# Patient Record
Sex: Female | Born: 1995 | Race: White | Hispanic: No | Marital: Married | Smoking: Former smoker
Health system: Southern US, Community
[De-identification: ages and names within clinical notes are randomized; demographics above are authoritative.]

## PROBLEM LIST (undated history)

## (undated) ENCOUNTER — Inpatient Hospital Stay: Payer: Self-pay

## (undated) DIAGNOSIS — J45909 Unspecified asthma, uncomplicated: Secondary | ICD-10-CM

## (undated) DIAGNOSIS — F32A Depression, unspecified: Secondary | ICD-10-CM

## (undated) DIAGNOSIS — F419 Anxiety disorder, unspecified: Secondary | ICD-10-CM

## (undated) HISTORY — PX: DENTAL SURGERY: SHX609

---
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
## 2017-09-19 DIAGNOSIS — G8929 Other chronic pain: Secondary | ICD-10-CM

## 2017-09-19 HISTORY — DX: Other chronic pain: G89.29

## 2019-09-08 DIAGNOSIS — F17210 Nicotine dependence, cigarettes, uncomplicated: Secondary | ICD-10-CM | POA: Insufficient documentation

## 2019-09-08 DIAGNOSIS — Z62819 Personal history of unspecified abuse in childhood: Secondary | ICD-10-CM | POA: Insufficient documentation

## 2019-09-08 DIAGNOSIS — Z800 Family history of malignant neoplasm of digestive organs: Secondary | ICD-10-CM | POA: Insufficient documentation

## 2019-09-08 DIAGNOSIS — J4520 Mild intermittent asthma, uncomplicated: Secondary | ICD-10-CM | POA: Insufficient documentation

## 2020-01-04 DIAGNOSIS — F411 Generalized anxiety disorder: Secondary | ICD-10-CM | POA: Insufficient documentation

## 2020-06-26 ENCOUNTER — Encounter: Payer: Self-pay | Admitting: Emergency Medicine

## 2020-06-26 ENCOUNTER — Ambulatory Visit (INDEPENDENT_AMBULATORY_CARE_PROVIDER_SITE_OTHER): Payer: BC Managed Care – PPO

## 2020-06-26 ENCOUNTER — Ambulatory Visit
Admission: EM | Admit: 2020-06-26 | Discharge: 2020-06-26 | Disposition: A | Discharge status: Home / Self Care | Payer: BC Managed Care – PPO | Attending: Family Medicine | Admitting: Family Medicine

## 2020-06-26 ENCOUNTER — Other Ambulatory Visit: Payer: Self-pay

## 2020-06-26 DIAGNOSIS — S92024A Nondisplaced fracture of anterior process of right calcaneus, initial encounter for closed fracture: Secondary | ICD-10-CM

## 2020-06-26 DIAGNOSIS — M79671 Pain in right foot: Secondary | ICD-10-CM

## 2020-06-26 HISTORY — DX: Anxiety disorder, unspecified: F41.9

## 2020-06-26 HISTORY — DX: Depression, unspecified: F32.A

## 2020-06-26 MED ORDER — NAPROXEN 500 MG PO TABS: 500.0000 mg | ORAL_TABLET | Freq: Two times a day (BID) | ORAL | 0 refills | Status: DC | PRN

## 2020-06-26 NOTE — Discharge Instructions (Signed)
Rest, ice, elevation.  Call Podiatry for an appt.  Medication as prescribed.  Take care  Dr. Adriana Simas

## 2020-06-26 NOTE — ED Provider Notes (Signed)
[facility]    CSN: [id number] Arrival date & time: 06/26/20  0919      History   Chief Complaint Chief Complaint  Patient presents with  . Foot Injury    right   HPI  24 year old female presents with the above complaint.  Patient reports that she was in the yard over the weekend.  She states that she stepped in a hole on Sunday.  Since that time she has had pain on the dorsum of the foot.  No bruising.  She has difficulty bearing weight.  Pain 6/10 in severity.  No relieving factors.  No other associated symptoms.  No other complaints.  Past Medical History:  Diagnosis Date  . Anxiety   . Depression     Home Medications    Prior to Admission medications   Medication Sig Start Date End Date Taking? Authorizing Provider  naproxen (NAPROSYN) 500 MG tablet Take 1 tablet (500 mg total) by mouth 2 (two) times daily as needed for moderate pain. 06/26/20   Tommie Sams, DO   Social History Social History   Tobacco Use  . Smoking status: Current Every Day Smoker  . Smokeless tobacco: Never Used  Vaping Use  . Vaping Use: Every day  Substance Use Topics  . Alcohol use: Yes  . Drug use: Never     Allergies   Patient has no known allergies.   Review of Systems Review of Systems Per HPI  Physical Exam Triage Vital Signs ED Triage Vitals  Enc Vitals Group     BP 06/26/20 0954 125/84     Pulse Rate 06/26/20 0954 78     Resp 06/26/20 0954 18     Temp 06/26/20 0954 98.3 F (36.8 C)     Temp Source 06/26/20 0954 Oral     SpO2 06/26/20 0954 100 %     Weight 06/26/20 0951 140 lb (63.5 kg)     Height 06/26/20 0951 5' (1.524 m)     Head Circumference --      Peak Flow --      Pain Score 06/26/20 0951 6     Pain Loc --      Pain Edu? --      Excl. in GC? --    Updated Vital Signs BP 125/84 (BP Location: Right Arm)   Pulse 78   Temp 98.3 F (36.8 C) (Oral)   Resp 18   Ht 5' (1.524 m)   Wt 63.5 kg   LMP 05/29/2020   SpO2 100%   BMI 27.34  kg/m   Visual Acuity Right Eye Distance:   Left Eye Distance:   Bilateral Distance:    Right Eye Near:   Left Eye Near:    Bilateral Near:     Physical Exam Vitals and nursing note reviewed.  Constitutional:      General: She is not in acute distress.    Appearance: Normal appearance. She is not ill-appearing.  HENT:     Head: Normocephalic and atraumatic.  Eyes:     General:        Right eye: No discharge.        Left eye: No discharge.     Conjunctiva/sclera: Conjunctivae normal.  Pulmonary:     Effort: Pulmonary effort is normal. No respiratory distress.  Musculoskeletal:     Comments: Right foot -patient with exquisite tenderness on the dorsum of the proximal forefoot.  Neurological:     Mental Status: She is alert.  Psychiatric:        Mood and Affect: Mood normal.        Behavior: Behavior normal.    UC Treatments / Results  Labs (all labs ordered are listed, but only abnormal results are displayed) Labs Reviewed - No data to display  EKG   Radiology DG Foot Complete Right  Result Date: 06/26/2020 CLINICAL DATA:  Foot pain after fall yesterday. EXAM: RIGHT FOOT COMPLETE - 3+ VIEW COMPARISON:  None. FINDINGS: Possible small fracture of the anterior calcaneal process, only seen on the oblique view. No additional fracture. No dislocation. Joint spaces are preserved. Bone mineralization is normal. Soft tissues are unremarkable. IMPRESSION: 1. Possible small fracture of the anterior calcaneal process, only seen on the oblique view. Correlate with point tenderness and consider CT for further evaluation. Electronically Signed   By: Obie Dredge M.D.   On: 06/26/2020 10:44    Procedures Procedures (including critical care time)  Medications Ordered in UC Medications - No data to display  Initial Impression / Assessment and Plan / UC Course  I have reviewed the triage vital signs and the nursing notes.  Pertinent labs & imaging results that were available  during my care of the patient were reviewed by me and considered in my medical decision making (see chart for details).    24 year old female presents with foot injury.  X-ray was obtained and was independently reviewed by me.  There appears to be a small fracture/avulsion of the anterior calcaneal process.  Patient is exquisitely tender at this location.  Nonweightbearing.  Patient given crutches.  Patient advised to see podiatry.  Naproxen as directed.  Final Clinical Impressions(s) / UC Diagnoses   Final diagnoses:  Closed nondisplaced fracture of anterior process of right calcaneus, initial encounter     Discharge Instructions     Rest, ice, elevation.  Call Podiatry for an appt.  Medication as prescribed.  Take care  Dr. Adriana Simas    ED Prescriptions    Medication Sig Dispense Auth. Provider   naproxen (NAPROSYN) 500 MG tablet Take 1 tablet (500 mg total) by mouth 2 (two) times daily as needed for moderate pain. 30 tablet Tommie Sams, DO     PDMP not reviewed this encounter.   Tommie Sams, [place] 06/26/20 1402

## 2020-06-26 NOTE — ED Triage Notes (Signed)
Patient states she tripped in a hole in the yard and fell injuring her right foot.

## 2020-07-08 ENCOUNTER — Other Ambulatory Visit: Payer: Self-pay | Admitting: Family Medicine

## 2021-07-28 ENCOUNTER — Encounter: Payer: Self-pay | Admitting: Emergency Medicine

## 2021-07-28 ENCOUNTER — Emergency Department
Admission: EM | Admit: 2021-07-28 | Discharge: 2021-07-28 | Disposition: A | Discharge status: Home / Self Care | Payer: Self-pay | Attending: Emergency Medicine | Admitting: Emergency Medicine

## 2021-07-28 ENCOUNTER — Emergency Department: Payer: Self-pay

## 2021-07-28 DIAGNOSIS — W540XXA Bitten by dog, initial encounter: Secondary | ICD-10-CM | POA: Insufficient documentation

## 2021-07-28 DIAGNOSIS — Z23 Encounter for immunization: Secondary | ICD-10-CM | POA: Insufficient documentation

## 2021-07-28 DIAGNOSIS — F17200 Nicotine dependence, unspecified, uncomplicated: Secondary | ICD-10-CM | POA: Insufficient documentation

## 2021-07-28 DIAGNOSIS — S61432A Puncture wound without foreign body of left hand, initial encounter: Secondary | ICD-10-CM | POA: Insufficient documentation

## 2021-07-28 MED ORDER — BACITRACIN ZINC 500 UNIT/GM EX OINT
TOPICAL_OINTMENT | Freq: Once | CUTANEOUS | Status: AC
  Administered 2021-07-28: 1 via TOPICAL
  Filled 2021-07-28: qty 0.9

## 2021-07-28 MED ORDER — OXYCODONE HCL 5 MG PO TABS
5.0000 mg | ORAL_TABLET | Freq: Once | ORAL | Status: AC
  Administered 2021-07-28: 5 mg via ORAL
  Filled 2021-07-28: qty 1

## 2021-07-28 MED ORDER — OXYCODONE-ACETAMINOPHEN 5-325 MG PO TABS: 2.0000 | ORAL_TABLET | Freq: Once | ORAL | Status: DC

## 2021-07-28 MED ORDER — OXYCODONE-ACETAMINOPHEN 5-325 MG PO TABS
1.0000 | ORAL_TABLET | Freq: Once | ORAL | Status: AC
  Administered 2021-07-28: 1 via ORAL
  Filled 2021-07-28: qty 1

## 2021-07-28 MED ORDER — ACETAMINOPHEN 325 MG PO TABS
650.0000 mg | ORAL_TABLET | Freq: Once | ORAL | Status: AC
  Administered 2021-07-28: 650 mg via ORAL
  Filled 2021-07-28: qty 2

## 2021-07-28 MED ORDER — AMOXICILLIN-POT CLAVULANATE 875-125 MG PO TABS
1.0000 | ORAL_TABLET | Freq: Once | ORAL | Status: AC
  Administered 2021-07-28: 1 via ORAL
  Filled 2021-07-28: qty 1

## 2021-07-28 MED ORDER — IBUPROFEN 800 MG PO TABS: 800.0000 mg | ORAL_TABLET | Freq: Three times a day (TID) | ORAL | 0 refills | Status: DC | PRN

## 2021-07-28 MED ORDER — OXYCODONE-ACETAMINOPHEN 5-325 MG PO TABS: 2.0000 | ORAL_TABLET | Freq: Four times a day (QID) | ORAL | 0 refills | Status: DC | PRN

## 2021-07-28 MED ORDER — AMOXICILLIN-POT CLAVULANATE 875-125 MG PO TABS: 1.0000 | ORAL_TABLET | Freq: Two times a day (BID) | ORAL | 0 refills | Status: AC

## 2021-07-28 MED ORDER — TETANUS-DIPHTH-ACELL PERTUSSIS 5-2.5-18.5 LF-MCG/0.5 IM SUSY
0.5000 mL | PREFILLED_SYRINGE | Freq: Once | INTRAMUSCULAR | Status: AC
  Administered 2021-07-28: 0.5 mL via INTRAMUSCULAR
  Filled 2021-07-28: qty 0.5

## 2021-07-28 NOTE — ED Triage Notes (Signed)
Pt in after dog bite to L hand from she and neighbor's dog while they were play-fighting. Puncture wounds present, limited ROM, bleeding controlled. Both her and the neighbor's dogs up to date on vaccines

## 2021-07-28 NOTE — ED Provider Notes (Signed)
[facility] Emergency Department Provider Note ____________________________________________   Event Date/Time   First MD Initiated Contact with Patient 07/28/21 [phone]     (approximate)  I have reviewed the triage vital signs and the nursing notes.   HISTORY  Chief Complaint Animal Bite    HPI Jaclyn Patterson is a 25 y.o. female with history of anxiety and depression who presents to the emergency department with a dog bite to the left hand.  She also has multiple scratches.  States she tried to break up a fight between her dog and her neighbors dog.  She states her dog is definitely up-to-date on rabies vaccinations and she thinks that her neighbors is as well.  She states that her neighbor has the dog in their custody.  No other injuries.  Was not knocked to the ground.  Did not hit her head or lose consciousness.         Past Medical History:  Diagnosis Date   Anxiety    Depression     There are no problems to display for this patient.   History reviewed. No pertinent surgical history.  Prior to Admission medications   Medication Sig Start Date End Date Taking? Authorizing Provider  amoxicillin-clavulanate (AUGMENTIN) 875-125 MG tablet Take 1 tablet by mouth 2 (two) times daily for 7 days. 07/28/21 08/04/21 Yes Dalisha Shively N, DO  ibuprofen (ADVIL) 800 MG tablet Take 1 tablet (800 mg total) by mouth every 8 (eight) hours as needed for mild pain. 07/28/21  Yes Keera Altidor, Layla Maw, DO  oxyCODONE-acetaminophen (PERCOCET/ROXICET) 5-325 MG tablet Take 2 tablets by mouth every 6 (six) hours as needed. 07/28/21  Yes Braelynn Benning N, DO  naproxen (NAPROSYN) 500 MG tablet Take 1 tablet (500 mg total) by mouth 2 (two) times daily as needed for moderate pain. 06/26/20   Tommie Sams, DO    Allergies Patient has no known allergies.  No family history on file.  Social History Social History   Tobacco Use   Smoking status: Every Day   Smokeless tobacco:  Never  Vaping Use   Vaping Use: Every day  Substance Use Topics   Alcohol use: Yes   Drug use: Never    Review of Systems Constitutional: No fever. Eyes: No visual changes. ENT: No sore throat. Cardiovascular: Denies chest pain. Respiratory: Denies shortness of breath. Gastrointestinal: No nausea, vomiting, diarrhea. Genitourinary: Negative for dysuria. Musculoskeletal: Negative for back pain. Skin: Negative for rash. Neurological: Negative for focal weakness or numbness.   ____________________________________________   PHYSICAL EXAM:  VITAL SIGNS: ED Triage Vitals  Enc Vitals Group     BP 07/28/21 0201 137/83     Pulse Rate 07/28/21 0201 (!) 124     Resp 07/28/21 0201 18     Temp 07/28/21 0201 97.9 F (36.6 C)     Temp Source 07/28/21 0201 Oral     SpO2 07/28/21 0201 97 %     Weight 07/28/21 0202 152 lb (68.9 kg)     Height --      Head Circumference --      Peak Flow --      Pain Score --      Pain Loc --      Pain Edu? --      Excl. in GC? --    CONSTITUTIONAL: Alert and oriented and responds appropriately to questions. Well-appearing; well-nourished; GCS 15 HEAD: Normocephalic; atraumatic EYES: Conjunctivae clear, PERRL, EOMI ENT: normal nose; no rhinorrhea; moist mucous  membranes; pharynx without lesions noted; no dental injury; no septal hematoma NECK: Supple, no meningismus, no LAD; no midline spinal tenderness, step-off or deformity; trachea midline CARD: RRR; S1 and S2 appreciated; no murmurs, no clicks, no rubs, no gallops RESP: Normal chest excursion without splinting or tachypnea; breath sounds clear and equal bilaterally; no wheezes, no rhonchi, no rales; no hypoxia or respiratory distress CHEST:  chest wall stable, no crepitus or ecchymosis or deformity, nontender to palpation; no flail chest ABD/GI: Normal bowel sounds; non-distended; soft, non-tender, no rebound, no guarding; no ecchymosis or other lesions noted PELVIS:  stable, nontender to  palpation BACK:  The back appears normal and is non-tender to palpation, there is no CVA tenderness; no midline spinal tenderness, step-off or deformity EXT: Multiple superficial abrasions to all 4 extremities.  She has superficial puncture wounds to her dorsal left hand and to the palm over the thenar eminence.  She has soft tissue swelling and ecchymosis to the left hand but no bony deformity.  She is able to move all of her fingers normally.  Compartments are soft in all of her extremities.  Extremities are warm and well-perfused.  Normal capillary refill.  Normal sensation. SKIN: Normal color for age and race; warm NEURO: Moves all extremities equally, normal speech, normal gait PSYCH: The patient's mood and manner are appropriate. Grooming and personal hygiene are appropriate.  ____________________________________________   LABS (all labs ordered are listed, but only abnormal results are displayed)  Labs Reviewed - No data to display ____________________________________________  EKG   ____________________________________________  RADIOLOGY I, Beckem Tomberlin, personally viewed and evaluated these images (plain radiographs) as part of my medical decision making, as well as reviewing the written report by the radiologist.  ED MD interpretation: X-ray of the left hand shows no fracture or dislocation.  Official radiology report(s): DG Hand Complete Left  Result Date: 07/28/2021 CLINICAL DATA:  Recent dog bite, initial encounter EXAM: LEFT HAND - COMPLETE 3+ VIEW COMPARISON:  None. FINDINGS: No acute fracture or dislocation is noted. No soft tissue abnormality is seen. IMPRESSION: No acute abnormality noted. Electronically Signed   By: Alcide Clever M.D.   On: 07/28/2021 03:01    ____________________________________________   PROCEDURES  Procedure(s) performed (including Critical Care):  Procedures    ____________________________________________   INITIAL IMPRESSION /  ASSESSMENT AND PLAN / ED COURSE  As part of my medical decision making, I reviewed the following data within the electronic MEDICAL RECORD NUMBER Nursing notes reviewed and incorporated, Old chart reviewed, Radiograph reviewed , Notes from prior ED visits, and [place] Controlled Substance Database         Patient here after a dog bite.  She has puncture wounds but no lacerations that need repair.  We have cleaned her wounds and applied bacitracin and sterile dressings.  Tetanus vaccination has been updated here.  Will discharge with Augmentin.  Provided with pain medication.  Discussed wound care instructions and return precautions.  Patient verbalized understanding.  I did instruct her to make sure that her neighbors dog is up-to-date on the rabies vaccines.  We discussed that if this dog was not up-to-date that she will need to return to the emergency department or urgent care immediately as she then would need rabies vaccines and immunoglobulin.  Patient verbalized understanding and is comfortable with this plan.  At this time, I do not feel there is any life-threatening condition present. I have reviewed, interpreted and discussed all results (EKG, imaging, lab, urine as appropriate) and  exam findings with patient/family. I have reviewed nursing notes and appropriate previous records.  I feel the patient is safe to be discharged home without further emergent workup and can continue workup as an outpatient as needed. Discussed usual and customary return precautions. Patient/family verbalize understanding and are comfortable with this plan.  Outpatient follow-up has been provided as needed. All questions have been answered.   ____________________________________________   FINAL CLINICAL IMPRESSION(S) / ED DIAGNOSES  Final diagnoses:  Dog bite, initial encounter     ED Discharge Orders          Ordered    ibuprofen (ADVIL) 800 MG tablet  Every 8 hours PRN        07/28/21 0512     amoxicillin-clavulanate (AUGMENTIN) 875-125 MG tablet  2 times daily        07/28/21 0512    oxyCODONE-acetaminophen (PERCOCET/ROXICET) 5-325 MG tablet  Every 6 hours PRN        07/28/21 2979            *Please note:  Jaclyn Patterson was evaluated in Emergency Department on 07/28/2021 for the symptoms described in the history of present illness. She was evaluated in the context of the global COVID-19 pandemic, which necessitated consideration that the patient might be at risk for infection with the SARS-CoV-2 virus that causes COVID-19. Institutional protocols and algorithms that pertain to the evaluation of patients at risk for COVID-19 are in a state of rapid change based on information released by regulatory bodies including the CDC and federal and state organizations. These policies and algorithms were followed during the patient's care in the ED.  Some ED evaluations and interventions may be delayed as a result of limited staffing during and the pandemic.*   Note:  This document was prepared using Dragon voice recognition software and may include unintentional dictation errors.    Aoki Wedemeyer, Layla Maw, DO 07/28/21 [phone]

## 2021-07-28 NOTE — ED Notes (Signed)
Pt washed hands with warm soapy water, dressings placed per md instructions, per md no need to call animal control, states that pt will follow up with her neighbor and return if it dog is unvaccinated.  Pt states that she is ready to go home, verbalized understanding d/c instructions and follow up. Ambulatory from dpt with friend.

## 2021-07-28 NOTE — Discharge Instructions (Addendum)
You are being provided a prescription for opiates (also known as narcotics) for pain control.  Opiates can be addictive and should only be used when absolutely necessary for pain control when other alternatives do not work.  We recommend you only use them for the recommended amount of time and only as prescribed.  Please do not take with other sedative medications or alcohol.  Please do not drive, operate machinery, make important decisions while taking opiates.  Please note that these medications can be addictive and have high abuse potential.  Patients can become addicted to narcotics after only taking them for a few days.  Please keep these medications locked away from children, teenagers or any family members with history of substance abuse.  Narcotic pain medicine may also make you constipated.  You may use over-the-counter medications such as MiraLAX, Colace to prevent constipation.  If you become constipated you may use over-the-counter enemas as needed.  Itching and nausea are common side effects of narcotic pain medication.  If you develop uncontrolled vomiting or a rash, please stop these medications.   You may clean your wounds gently with warm soap and water 1-2 times a day and apply bacitracin ointment once daily.  Your x-ray showed no sign of fracture.  I recommend that you make sure that your neighbors dog is up-to-date on his rabies vaccines.  If not, please return to the emergency department or urgent care as you will need to start rabies vaccines yourself.  Please take your antibiotics until complete.  Dog bites are very high risk to become infected.

## 2021-07-28 NOTE — ED Provider Notes (Signed)
Emergency Medicine Provider Triage Evaluation Note  Jaclyn Patterson , a 25 y.o. female  was evaluated in triage.  Pt complains of left hand pain.  She was trying to break up a dog fight between her large dog and the neighbors large dog.  She is not sure who bit her.  She has a few lacerations and pain and swelling throughout the hand.  She rinsed her hand off at home.  Uncertain date of last tetanus vaccination.  Review of Systems  Positive: Multiple lacerations due to dog bite, swelling, pain Negative: Any other injuries, loss of consciousness, nausea, vomiting.  Physical Exam  BP 137/83   Pulse (!) 124   Temp 97.9 F (36.6 C) (Oral)   Resp 18   Wt 68.9 kg   SpO2 97%   BMI 29.69 kg/m  Gen:   Awake, appears uncomfortable but not in severe distress Resp:  Normal effort  MSK:   Multiple lacerations that appear superficial to the patient's hand and fingers but with ecchymosis and swelling throughout the hand suggestive of a crush injury.  Compartments are soft and easily compressible. Other:  Neurovascularly intact.  Medical Decision Making  Medically screening exam initiated at 2:02 AM.  Appropriate orders placed.  Jaclyn Patterson was informed that the remainder of the evaluation will be completed by another provider, this initial triage assessment does not replace that evaluation, and the importance of remaining in the ED until their evaluation is complete.  Tdap, Percocet x1 with an additional acetaminophen 650 mg, Augmentin, x-rays.  I had the patient rinse her hand off again thoroughly to try to get it as clean as possible while she is awaiting further evaluation.   Jaclyn Rose, MD 07/28/21 [phone]

## 2021-07-28 NOTE — ED Notes (Signed)
Pt ambulatory to er hallway 18, pt states that she broke up a fight between her dog and her neighbor's dog, pt has scratches to R leg and arms, pt has puncture wounds to her L hand, pt has some swelling to L hand, bleeding is controled/ stopped at this time, pt states that both dogs have had their shots.  Pt appears anxious, attempt to calm and support pt.

## 2021-09-09 NOTE — L&D Delivery Note (Addendum)
Operative Delivery Note At 4:43 PM a viable and healthy female "Mae" was delivered via VBAC, Vacuum Assisted.  Presentation: vertex; Position: Right,OA; Station: -2.  Verbal consent: obtained from patient.  Risks and benefits discussed in detail.  Risks include, but are not limited to the risks of anesthesia, bleeding, infection, damage to maternal tissues, fetal cephalhematoma.  There is also the risk of inability to effect vaginal delivery of the head, or shoulder dystocia that cannot be resolved by established maneuvers, leading to the need for emergency cesarean section.  Deep variables with pushing  APGAR: 7, 9; weight pending.   Placenta status: expressed, .   Cord: 3vc with the following complications: nuchal x2.  Cord pH: pending  Anesthesia:  epidural, 36ml lidocaine Instruments: flat kiwi Episiotomy: None Lacerations: 2nd degree Suture Repair: 2.0 vicryl Est. Blood Loss (mL):  300  Mom to postpartum.  Baby to Couplet care / Skin to Skin.  Christeen Douglas 08/07/2022, 5:09 PM

## 2021-12-31 LAB — OB RESULTS CONSOLE VARICELLA ZOSTER ANTIBODY, IGG: Varicella: NON-IMMUNE/NOT IMMUNE

## 2022-01-03 DIAGNOSIS — Z3490 Encounter for supervision of normal pregnancy, unspecified, unspecified trimester: Secondary | ICD-10-CM | POA: Insufficient documentation

## 2022-01-03 LAB — OB RESULTS CONSOLE ABO/RH: RH Type: POSITIVE

## 2022-01-03 LAB — OB RESULTS CONSOLE RUBELLA ANTIBODY, IGM: Rubella: NON-IMMUNE/NOT IMMUNE

## 2022-01-03 LAB — OB RESULTS CONSOLE GC/CHLAMYDIA
Chlamydia: NEGATIVE
Neisseria Gonorrhea: NEGATIVE

## 2022-01-03 LAB — OB RESULTS CONSOLE RPR: RPR: NONREACTIVE

## 2022-01-03 LAB — OB RESULTS CONSOLE HIV ANTIBODY (ROUTINE TESTING): HIV: NONREACTIVE

## 2022-01-03 LAB — OB RESULTS CONSOLE HEPATITIS B SURFACE ANTIGEN: Hepatitis B Surface Ag: NEGATIVE

## 2022-01-03 LAB — OB RESULTS CONSOLE ANTIBODY SCREEN: Antibody Screen: NEGATIVE

## 2022-01-03 LAB — HEPATITIS C ANTIBODY: HCV Ab: NEGATIVE

## 2022-01-08 DIAGNOSIS — R011 Cardiac murmur, unspecified: Secondary | ICD-10-CM | POA: Insufficient documentation

## 2022-03-15 ENCOUNTER — Other Ambulatory Visit: Payer: Self-pay

## 2022-03-15 ENCOUNTER — Emergency Department: Payer: Medicaid Other

## 2022-03-15 ENCOUNTER — Encounter: Payer: Self-pay | Admitting: Emergency Medicine

## 2022-03-15 DIAGNOSIS — O26892 Other specified pregnancy related conditions, second trimester: Secondary | ICD-10-CM | POA: Insufficient documentation

## 2022-03-15 DIAGNOSIS — Z3A18 18 weeks gestation of pregnancy: Secondary | ICD-10-CM | POA: Insufficient documentation

## 2022-03-15 DIAGNOSIS — M545 Low back pain, unspecified: Secondary | ICD-10-CM | POA: Diagnosis not present

## 2022-03-15 DIAGNOSIS — O99891 Other specified diseases and conditions complicating pregnancy: Secondary | ICD-10-CM | POA: Insufficient documentation

## 2022-03-15 DIAGNOSIS — R1032 Left lower quadrant pain: Secondary | ICD-10-CM | POA: Insufficient documentation

## 2022-03-15 LAB — CBC
HCT: 33.8 % — ABNORMAL LOW (ref 36.0–46.0)
Hemoglobin: 11.6 g/dL — ABNORMAL LOW (ref 12.0–15.0)
MCH: 29.7 pg (ref 26.0–34.0)
MCHC: 34.3 g/dL (ref 30.0–36.0)
MCV: 86.4 fL (ref 80.0–100.0)
Platelets: 262 10*3/uL (ref 150–400)
RBC: 3.91 MIL/uL (ref 3.87–5.11)
RDW: 12.3 % (ref 11.5–15.5)
WBC: 8.6 10*3/uL (ref 4.0–10.5)
nRBC: 0 % (ref 0.0–0.2)

## 2022-03-15 LAB — URINALYSIS, ROUTINE W REFLEX MICROSCOPIC
Bilirubin Urine: NEGATIVE
Glucose, UA: NEGATIVE mg/dL
Hgb urine dipstick: NEGATIVE
Ketones, ur: NEGATIVE mg/dL
Leukocytes,Ua: NEGATIVE
Nitrite: NEGATIVE
Protein, ur: NEGATIVE mg/dL
Specific Gravity, Urine: 1.006 (ref 1.005–1.030)
pH: 7 (ref 5.0–8.0)

## 2022-03-15 LAB — COMPREHENSIVE METABOLIC PANEL
ALT: 21 U/L (ref 0–44)
AST: 21 U/L (ref 15–41)
Albumin: 3.4 g/dL — ABNORMAL LOW (ref 3.5–5.0)
Alkaline Phosphatase: 56 U/L (ref 38–126)
Anion gap: 7 (ref 5–15)
BUN: <5 mg/dL — ABNORMAL LOW (ref 6–20)
CO2: 21 mmol/L — ABNORMAL LOW (ref 22–32)
Calcium: 8.9 mg/dL (ref 8.9–10.3)
Chloride: 109 mmol/L (ref 98–111)
Creatinine, Ser: 0.59 mg/dL (ref 0.44–1.00)
GFR, Estimated: >60 mL/min (ref >60)
Glucose, Bld: 108 mg/dL — ABNORMAL HIGH (ref 70–99)
Potassium: 3.3 mmol/L — ABNORMAL LOW (ref 3.5–5.1)
Sodium: 137 mmol/L (ref 135–145)
Total Bilirubin: 0.4 mg/dL (ref 0.3–1.2)
Total Protein: 6.5 g/dL (ref 6.5–8.1)

## 2022-03-15 LAB — HCG, QUANTITATIVE, PREGNANCY: hCG, Beta Chain, Quant, S: 28420 m[IU]/mL — ABNORMAL HIGH (ref <5)

## 2022-03-15 NOTE — ED Provider Triage Note (Signed)
Emergency Medicine Provider Triage Evaluation Note  Jaclyn Patterson, a 26 y.o. G1, P0 female at [redacted] weeks gestation was evaluated in triage.  Pt complains of left lower quadrant abdominal pain with referral into the back and buttocks.  Patient reports symptoms onset about 4 hours prior to arrival.  She denies any vaginal bleeding or pregnancy related complaints.  Review of Systems  Positive: LLQ abdominal pain Negative: Vaginal bleeding, CP, SOB  Physical Exam  BP 138/78   Pulse 94   Temp 98 F (36.7 C) (Oral)   Resp 16   Ht 5' (1.524 m)   Wt 69.4 kg   LMP 07/25/2021   SpO2 100%   BMI 29.88 kg/m  Gen:   Awake, no distress uncomfortable Resp:  Normal effort CTA MSK:   Moves extremities without difficulty  Other:  Gravid, tender to palp over LLQ  Medical Decision Making  Medically screening exam initiated at 7:50 PM.  Appropriate orders placed.  Amos Kannan was informed that the remainder of the evaluation will be completed by another provider, this initial triage assessment does not replace that evaluation, and the importance of remaining in the ED until their evaluation is complete.  Pregnant female 19 weeks with sudden onset of left lower quadrant abdominal pain.  He denies any nausea or vomiting.  Patient denies any urgency related concerns.   Lissa Hoard, PA-C 03/15/22 [id number]

## 2022-03-15 NOTE — ED Triage Notes (Signed)
Pt states llq pain that radiates to her back and into her buttocks for last hour. Pt denies vaginal bleeding pt is 19 weeks preg today, G1, P0.

## 2022-03-16 ENCOUNTER — Emergency Department
Admission: EM | Admit: 2022-03-16 | Discharge: 2022-03-16 | Disposition: A | Discharge status: Home / Self Care | Payer: Medicaid Other | Attending: Emergency Medicine | Admitting: Emergency Medicine

## 2022-03-16 DIAGNOSIS — R1032 Left lower quadrant pain: Secondary | ICD-10-CM

## 2022-03-16 MED ORDER — LIDOCAINE 5 % EX PTCH
1.0000 | MEDICATED_PATCH | CUTANEOUS | Status: DC
  Administered 2022-03-16: 1 via TRANSDERMAL
  Filled 2022-03-16: qty 1

## 2022-03-16 MED ORDER — LIDOCAINE 5 % EX PTCH: 1.0000 | MEDICATED_PATCH | CUTANEOUS | 0 refills | Status: AC

## 2022-03-16 MED ORDER — ACETAMINOPHEN 500 MG PO TABS
1000.0000 mg | ORAL_TABLET | Freq: Once | ORAL | Status: AC
  Administered 2022-03-16: 1000 mg via ORAL
  Filled 2022-03-16: qty 2

## 2022-03-16 NOTE — Discharge Instructions (Signed)
You may take Tylenol 1000 mg every 6 hours as needed for pain.  You may alternate ice and heat to your back.  Please do not sleep with a heating pad as this can be dangerous and cause burns.  I do not recommend that you put the heating pad on your abdomen.  You may use numbing patches to your back.  Please follow-up if your OB/GYN if symptoms or not improving.  If you develop fever of 100.4 or higher, vaginal bleeding, vomiting that does not stop, numbness, weakness in your legs, difficulty holding your bowel or bladder, difficulty emptying your bladder, please return to the emergency department.

## 2022-03-16 NOTE — ED Notes (Signed)
E-signature pad unavailable - Pt verbalized understanding of D/C information - no additional concerns at this time.  

## 2022-03-16 NOTE — ED Provider Notes (Signed)
[facility] Provider Note    Event Date/Time   First MD Initiated Contact with Patient 03/16/22 0031     (approximate)   History   Abdominal Pain   HPI  Jaclyn Patterson is a 26 y.o. female who is approximately [redacted] weeks pregnant who presents to the emergency department with sharp left lower quadrant pain that is worse with moving especially with rolling over onto her side.  She also has some left lower back pain, pain over the SI joint.  States she was worried because she has had 2 previous miscarriages and has never made it this far in the pregnancy before.  No fevers, nausea, vomiting, diarrhea,, bloody stools, melena, dysuria, hematuria, vaginal bleeding or discharge.  No numbness, tingling, weakness, bowel or bladder incontinence, urinary retention.    History provided by patient and significant other.    Past Medical History:  Diagnosis Date   Anxiety    Depression     No past surgical history on file.  MEDICATIONS:  Prior to Admission medications   Medication Sig Start Date End Date Taking? Authorizing Provider  ibuprofen (ADVIL) 800 MG tablet Take 1 tablet (800 mg total) by mouth every 8 (eight) hours as needed for mild pain. 07/28/21   Akeelah Seppala, Delice Bison, DO  naproxen (NAPROSYN) 500 MG tablet Take 1 tablet (500 mg total) by mouth 2 (two) times daily as needed for moderate pain. 06/26/20   Coral Spikes, DO  oxyCODONE-acetaminophen (PERCOCET/ROXICET) 5-325 MG tablet Take 2 tablets by mouth every 6 (six) hours as needed. 07/28/21   Traylon Schimming, Delice Bison, DO    Physical Exam   Triage Vital Signs: ED Triage Vitals  Enc Vitals Group     BP 03/15/22 1928 138/78     Pulse Rate 03/15/22 1928 94     Resp 03/15/22 1928 16     Temp 03/15/22 1928 98 F (36.7 C)     Temp Source 03/15/22 1928 Oral     SpO2 03/15/22 1928 100 %     Weight 03/15/22 1929 153 lb (69.4 kg)     Height 03/15/22 1929 5' (1.524 m)     Head Circumference --      Peak Flow --       Pain Score 03/15/22 1929 6     Pain Loc --      Pain Edu? --      Excl. in [place]? --     Most recent vital signs: Vitals:   03/15/22 1928 03/15/22 2328  BP: 138/78 118/72  Pulse: 94 79  Resp: 16 16  Temp: 98 F (36.7 C)   SpO2: 100% 100%    CONSTITUTIONAL: Alert and oriented and responds appropriately to questions. Well-appearing; well-nourished HEAD: Normocephalic, atraumatic EYES: Conjunctivae clear, pupils appear equal, sclera nonicteric ENT: normal nose; moist mucous membranes NECK: Supple, normal ROM CARD: RRR; S1 and S2 appreciated; no murmurs, no clicks, no rubs, no gallops RESP: Normal chest excursion without splinting or tachypnea; breath sounds clear and equal bilaterally; no wheezes, no rhonchi, no rales, no hypoxia or respiratory distress, speaking full sentences ABD/GI: Normal bowel sounds; non-distended; soft, mild tenderness in the left lower abdomen without guarding or rebound BACK: The back appears normal, no midline spinal tenderness or step-off or deformity.  Patient has some tenderness over the left SI joint without overlying redness, warmth, ecchymosis. EXT: Normal ROM in all joints; no deformity noted, no edema; no cyanosis SKIN: Normal color for age and race; warm; no  rash on exposed skin NEURO: Moves all extremities equally, normal speech, normal sensation diffusely, normal gait PSYCH: The patient's mood and manner are appropriate.   ED Results / Procedures / Treatments   LABS: (all labs ordered are listed, but only abnormal results are displayed) Labs Reviewed  COMPREHENSIVE METABOLIC PANEL - Abnormal; Notable for the following components:      Result Value   Potassium 3.3 (*)    CO2 21 (*)    Glucose, Bld 108 (*)    BUN <5 (*)    Albumin 3.4 (*)    All other components within normal limits  CBC - Abnormal; Notable for the following components:   Hemoglobin 11.6 (*)    HCT 33.8 (*)    All other components within normal limits  URINALYSIS, ROUTINE  W REFLEX MICROSCOPIC - Abnormal; Notable for the following components:   Color, Urine YELLOW (*)    APPearance HAZY (*)    All other components within normal limits  HCG, QUANTITATIVE, PREGNANCY - Abnormal; Notable for the following components:   hCG, Beta Chain, Quant, S 28,420 (*)    All other components within normal limits     EKG:     RADIOLOGY: My personal review and interpretation of imaging: Ultrasound shows normal intrauterine pregnancy with normal fetal heart rate.  I have personally reviewed all radiology reports.   US OB Limited  Result Date: 03/15/2022 CLINICAL DATA:  Left lower quadrant pain for several hours, known second trimester pregnancy EXAM: LIMITED OBSTETRIC ULTRASOUND COMPARISON:  None Available. FINDINGS: Number of Fetuses: 1 Heart Rate:  143 bpm Movement: Present Presentation: Cephalic Placental Location: Anterior Previa: Absent Amniotic Fluid (Subjective):  Within normal limits. AFI: 4.5 cm BPD: 4 cm 18 w  1 d MATERNAL FINDINGS: Cervix:  Appears closed. Uterus/Adnexae: No abnormality visualized. Right ovary is not well visualized. IMPRESSION: Single live intrauterine gestation at 18 weeks 1 day. No acute abnormality noted. This exam is performed on an emergent basis and does not comprehensively evaluate fetal size, dating, or anatomy; follow-up complete OB US should be considered if further fetal assessment is warranted. Electronically Signed   By: Alcide Clever M.D.   On: 03/15/2022 20:22     PROCEDURES:  Critical Care performed: No      Procedures    IMPRESSION / MDM / ASSESSMENT AND PLAN / ED COURSE  I reviewed the triage vital signs and the nursing notes.    Patient here with left pelvic pain, left lower back pain worse after moving.  Currently about [redacted] weeks pregnant.     DIFFERENTIAL DIAGNOSIS (includes but not limited to):   Suspect round ligament pain.  Pain also may be due to musculoskeletal back pain, sacroiliitis.  Doubt epidural  abscess, hematoma, discitis, osteomyelitis, transverse myelitis, severe spinal stenosis.  Low suspicion for diverticulitis, colitis, bowel obstruction, ovarian torsion, TOA, PID.   Patient's presentation is most consistent with acute presentation with potential threat to life or bodily function.   PLAN: We will obtain CBC, CMP, urinalysis, pelvic ultrasound.  Will give Tylenol and Lidoderm patch for pain control.   MEDICATIONS GIVEN IN ED: Medications  lidocaine (LIDODERM) 5 % 1 patch (1 patch Transdermal Patch Applied 03/16/22 0112)  acetaminophen (TYLENOL) tablet 1,000 mg (1,000 mg Oral Given 03/16/22 0112)     ED COURSE: Labs show no leukocytosis.  Normal electrolytes, renal function, LFTs.  Urine shows no sign of infection.  No proteinuria, no ketonuria.  hCG is greater than 28,000.  Ultrasound reviewed/interpreted  by myself and radiology and shows single live intrauterine pregnancy measuring 18 weeks and 1 day with normal fetal heart tone and normal movement.  No significant abnormality noted of the left ovary.  Patient comfortable discharging with Lidoderm patches and using over-the-counter Tylenol for pain control.  Recommended alternating heat, ice to her back, massage, stretching.  She will follow-up with her OB/GYN if symptoms or not improving.  Discussed return precautions with patient and partner.   At this time, I do not feel there is any life-threatening condition present. I reviewed all nursing notes, vitals, pertinent previous records.  All lab and urine results, EKGs, imaging ordered have been independently reviewed and interpreted by myself.  I reviewed all available radiology reports from any imaging ordered this visit.  Based on my assessment, I feel the patient is safe to be discharged home without further emergent workup and can continue workup as an outpatient as needed. Discussed all findings, treatment plan as well as usual and customary return precautions.  They verbalize  understanding and are comfortable with this plan.  Outpatient follow-up has been provided as needed.  All questions have been answered.    CONSULTS: No admission or OB/GYN consult needed today as patient's work-up has been reassuring and pain likely due to round ligament pain.   OUTSIDE RECORDS REVIEWED: Reviewed patient's last OB/GYN note on 02/28/2022.  It appears during that visit she complained of intermittent lower abdominal pain as well.       FINAL CLINICAL IMPRESSION(S) / ED DIAGNOSES   Final diagnoses:  None     Rx / DC Orders   ED Discharge Orders     None        Note:  This document was prepared using Dragon voice recognition software and may include unintentional dictation errors.   Tyberius Ryner, Layla Maw, DO 03/16/22 1002

## 2022-05-23 ENCOUNTER — Observation Stay
Admission: EM | Admit: 2022-05-23 | Discharge: 2022-05-23 | Disposition: A | Discharge status: Home / Self Care | Payer: Medicaid Other | Attending: Obstetrics and Gynecology | Admitting: Obstetrics and Gynecology

## 2022-05-23 ENCOUNTER — Other Ambulatory Visit: Payer: Self-pay

## 2022-05-23 ENCOUNTER — Observation Stay: Payer: Medicaid Other

## 2022-05-23 DIAGNOSIS — O26899 Other specified pregnancy related conditions, unspecified trimester: Secondary | ICD-10-CM | POA: Diagnosis present

## 2022-05-23 DIAGNOSIS — Z3A28 28 weeks gestation of pregnancy: Secondary | ICD-10-CM | POA: Insufficient documentation

## 2022-05-23 DIAGNOSIS — Z79899 Other long term (current) drug therapy: Secondary | ICD-10-CM | POA: Diagnosis not present

## 2022-05-23 DIAGNOSIS — J45909 Unspecified asthma, uncomplicated: Secondary | ICD-10-CM | POA: Insufficient documentation

## 2022-05-23 DIAGNOSIS — O26893 Other specified pregnancy related conditions, third trimester: Secondary | ICD-10-CM | POA: Diagnosis present

## 2022-05-23 DIAGNOSIS — O26833 Pregnancy related renal disease, third trimester: Secondary | ICD-10-CM | POA: Diagnosis not present

## 2022-05-23 DIAGNOSIS — N200 Calculus of kidney: Secondary | ICD-10-CM | POA: Diagnosis not present

## 2022-05-23 DIAGNOSIS — O99513 Diseases of the respiratory system complicating pregnancy, third trimester: Secondary | ICD-10-CM | POA: Diagnosis not present

## 2022-05-23 DIAGNOSIS — O99333 Smoking (tobacco) complicating pregnancy, third trimester: Secondary | ICD-10-CM | POA: Diagnosis not present

## 2022-05-23 DIAGNOSIS — F17200 Nicotine dependence, unspecified, uncomplicated: Secondary | ICD-10-CM | POA: Insufficient documentation

## 2022-05-23 DIAGNOSIS — R1031 Right lower quadrant pain: Secondary | ICD-10-CM | POA: Insufficient documentation

## 2022-05-23 HISTORY — DX: Unspecified asthma, uncomplicated: J45.909

## 2022-05-23 LAB — CBC
HCT: 33.6 % — ABNORMAL LOW (ref 36.0–46.0)
Hemoglobin: 11.3 g/dL — ABNORMAL LOW (ref 12.0–15.0)
MCH: 29.7 pg (ref 26.0–34.0)
MCHC: 33.6 g/dL (ref 30.0–36.0)
MCV: 88.2 fL (ref 80.0–100.0)
Platelets: 193 10*3/uL (ref 150–400)
RBC: 3.81 MIL/uL — ABNORMAL LOW (ref 3.87–5.11)
RDW: 12.6 % (ref 11.5–15.5)
WBC: 14.7 10*3/uL — ABNORMAL HIGH (ref 4.0–10.5)
nRBC: 0 % (ref 0.0–0.2)

## 2022-05-23 LAB — WET PREP, GENITAL
Clue Cells Wet Prep HPF POC: NONE SEEN
Sperm: NONE SEEN
Trich, Wet Prep: NONE SEEN
WBC, Wet Prep HPF POC: <10 — AB (ref <10)
Yeast Wet Prep HPF POC: NONE SEEN

## 2022-05-23 LAB — COMPREHENSIVE METABOLIC PANEL
ALT: 18 U/L (ref 0–44)
AST: 18 U/L (ref 15–41)
Albumin: 3.3 g/dL — ABNORMAL LOW (ref 3.5–5.0)
Alkaline Phosphatase: 57 U/L (ref 38–126)
Anion gap: 8 (ref 5–15)
BUN: 9 mg/dL (ref 6–20)
CO2: 21 mmol/L — ABNORMAL LOW (ref 22–32)
Calcium: 9.2 mg/dL (ref 8.9–10.3)
Chloride: 107 mmol/L (ref 98–111)
Creatinine, Ser: 0.7 mg/dL (ref 0.44–1.00)
GFR, Estimated: >60 mL/min (ref >60)
Glucose, Bld: 89 mg/dL (ref 70–99)
Potassium: 3.5 mmol/L (ref 3.5–5.1)
Sodium: 136 mmol/L (ref 135–145)
Total Bilirubin: 0.4 mg/dL (ref 0.3–1.2)
Total Protein: 6.6 g/dL (ref 6.5–8.1)

## 2022-05-23 LAB — URINALYSIS, COMPLETE (UACMP) WITH MICROSCOPIC
Bilirubin Urine: NEGATIVE
Glucose, UA: NEGATIVE mg/dL
Ketones, ur: NEGATIVE mg/dL
Leukocytes,Ua: NEGATIVE
Nitrite: NEGATIVE
Protein, ur: NEGATIVE mg/dL
Specific Gravity, Urine: 1.008 (ref 1.005–1.030)
pH: 6 (ref 5.0–8.0)

## 2022-05-23 LAB — CHLAMYDIA/NGC RT PCR (ARMC ONLY)
Chlamydia Tr: NOT DETECTED
N gonorrhoeae: NOT DETECTED

## 2022-05-23 LAB — FETAL FIBRONECTIN: Fetal Fibronectin: NEGATIVE

## 2022-05-23 MED ORDER — ACETAMINOPHEN 500 MG PO TABS: 1000.0000 mg | ORAL_TABLET | Freq: Four times a day (QID) | ORAL | 0 refills | Status: AC | PRN

## 2022-05-23 MED ORDER — LACTATED RINGERS IV BOLUS
1000.0000 mL | Freq: Once | INTRAVENOUS | Status: AC
  Administered 2022-05-23: 1000 mL via INTRAVENOUS

## 2022-05-23 MED ORDER — OXYCODONE HCL 5 MG PO TABS: 5.0000 mg | ORAL_TABLET | ORAL | 0 refills | Status: AC | PRN

## 2022-05-23 MED ORDER — ACETAMINOPHEN 500 MG PO TABS
1000.0000 mg | ORAL_TABLET | Freq: Four times a day (QID) | ORAL | Status: DC | PRN
  Administered 2022-05-23 (×2): 1000 mg via ORAL
  Filled 2022-05-23 (×2): qty 2

## 2022-05-23 MED ORDER — CALCIUM CARBONATE ANTACID 500 MG PO CHEW: 2.0000 | CHEWABLE_TABLET | ORAL | Status: DC | PRN

## 2022-05-23 MED ORDER — LACTATED RINGERS IV SOLN: INTRAVENOUS | Status: DC

## 2022-05-23 NOTE — OB Triage Note (Signed)
Patient 25 yrs, G1P0 and 28wks6d, complaining of 7/10 right sided abdominal pain that intermittently radiates to her back. Patient also complains of right sided pelvic pressure and urgency to pee. Symptoms began appx 2hrs prior to arriving to hospital. Patient reports positive FM, denies vaginal discharge or bright red bleeding. Patient did have intercourse within past 2 days. Initial FHM 125bpm.

## 2022-05-23 NOTE — H&P (Signed)
Jaclyn Patterson is a 26 y.o. female. She is at [redacted]w[redacted]d gestation. Patient's last menstrual period was 07/25/2021. Estimated Date of Delivery: 08/09/22  Prenatal care site: [facility]  Chief complaint: abdominal and vaginal pain   HPI: Jaclyn Patterson presents to L&D with complaints of acute onset lower abdominal pain and vaginal pain.  States pain started just after midnight, more significant on RLQ > LLQ, pain intermittently radiates to back.  Reports increased urinary urgency and pelvic pressure.  Denies vaginal bleeding or contractions.  States she had intercourse yesterday morning.  Endorses good fetal movement.   Factors complicating pregnancy: Rubella and varicella non-immune Anxiety and depression Maternal heart murmur   S: Holding lower abdomen, mild distress, intermittently tearful, unsure of contractions, no VB.no LOF,  Active fetal movement.   Maternal Medical History:  Past Medical Hx:  has a past medical history of Anxiety, Asthma, and Depression.    Past Surgical Hx:  has a past surgical history that includes Dental surgery.   Allergies  Allergen Reactions   Almond (Diagnostic) Anaphylaxis     Prior to Admission medications   Medication Sig Start Date End Date Taking? Authorizing Provider  acetaminophen (TYLENOL) 325 MG tablet Take 650 mg by mouth every 6 (six) hours as needed for moderate pain.   Yes [provider]  sertraline (ZOLOFT) 100 MG tablet Take 100 mg by mouth daily.   Yes [provider]  ibuprofen (ADVIL) 800 MG tablet Take 1 tablet (800 mg total) by mouth every 8 (eight) hours as needed for mild pain. 07/28/21   Ward, Layla Maw, DO  naproxen (NAPROSYN) 500 MG tablet Take 1 tablet (500 mg total) by mouth 2 (two) times daily as needed for moderate pain. 06/26/20   Tommie Sams, DO  oxyCODONE-acetaminophen (PERCOCET/ROXICET) 5-325 MG tablet Take 2 tablets by mouth every 6 (six) hours as needed. 07/28/21   Ward, Layla Maw, DO    Social  History: She  reports that she has been smoking. She has never used smokeless tobacco. She reports current alcohol use. She reports that she does not use drugs.  Family History: family history non-contributory, no history of gyn cancers   Review of Systems: A full review of systems was performed and negative except as noted in the HPI.     Pertinent Results:  Prenatal Labs: Blood type/Rh A pos   Antibody screen Negative    Rubella Non-Immune    Varicella Not immune  RPR NR  HBsAg Neg  Hep C NR   HIV NR  GC neg  Chlamydia neg  Genetic screening cfDNA negative/AFP neg  1 hour GTT Not done yet   3 hour GTT N/A  GBS N/A    O:  BP 122/77 (BP Location: Right Arm)   Pulse 76   Temp 98.3 F (36.8 C) (Oral)   LMP 07/25/2021  Results for orders placed or performed during the hospital encounter of 05/23/22 (from the past 48 hour(s))  Urinalysis, Complete w Microscopic Urine, Clean Catch   Collection Time: 05/23/22  3:19 AM  Result Value Ref Range   Color, Urine STRAW (A) YELLOW   APPearance CLEAR (A) CLEAR   Specific Gravity, Urine 1.008 1.005 - 1.030   pH 6.0 5.0 - 8.0   Glucose, UA NEGATIVE NEGATIVE mg/dL   Hgb urine dipstick MODERATE (A) NEGATIVE   Bilirubin Urine NEGATIVE NEGATIVE   Ketones, ur NEGATIVE NEGATIVE mg/dL   Protein, ur NEGATIVE NEGATIVE mg/dL   Nitrite NEGATIVE NEGATIVE  Leukocytes,Ua NEGATIVE NEGATIVE   RBC / HPF 6-10 0 - 5 RBC/hpf   WBC, UA 11-20 0 - 5 WBC/hpf   Bacteria, UA RARE (A) NONE SEEN   Squamous Epithelial / LPF 0-5 0 - 5   Mucus PRESENT    Ca Oxalate Crys, UA PRESENT      Constitutional: NAD, AAOx3  HE/ENT: extraocular movements grossly intact, moist mucous membranes CV: RRR PULM: nl respiratory effort Abd: gravid, tender to palpation bilaterally over lower quadrant with R>L Ext: Non-tender, Nonedmeatous Psych: mood appropriate, speech normal Pelvic :  Mild erythema present, moderate amount of physiologic and thick white discharge  present  SVE: Dilation: Closed Effacement (%): Thick Exam by:: Margaretmary Eddy CNM   Fetal Monitor: Baseline: 130 bpm Variability: moderate Accels: Present Decels: none Toco: Irregular, mild contractions, ranging from 2-7 min Time: at least 20 min  Category: I NST: Reactive, appropriate for gestational age    Assessment: 26 y.o. [redacted]w[redacted]d here for antenatal surveillance during pregnancy.  Principle diagnosis: Abdominal pain affecting pregnancy   Plan: Preterm labor not likely - cervix is long and closed FFN, Wet prep, and GC/CT pending  NST reviewed - reactive tracing  Fetal Wellbeing: Reassuring Cat 1 tracing. Abdominal pain RLQ > LLQ Diffuse lower abdominal tenderness noted R>L, patient guarding over McBurney's point, no rebound tenderness  CBC and CMP ordered  IV Fluid bolus and maintenance ordered  Tylenol PRN pain  Will consider Renal US and RLQ [place] as indicated for continued pain ----- Margaretmary Eddy, CNM Certified Nurse Midwife [facility]

## 2022-05-23 NOTE — Discharge Summary (Signed)
Jaclyn Patterson is a 26 y.o. female. She is at [redacted]w[redacted]d gestation. Patient's last menstrual period was 07/25/2021. Estimated Date of Delivery: 08/09/22  Prenatal care site: [facility]  Chief complaint: abdominal and vaginal pain   HPI: Jaclyn Patterson presents to L&D with complaints of acute onset lower abdominal pain and vaginal pain.  States pain started just after midnight, more significant on RLQ > LLQ, pain intermittently radiates to back.  Reports increased urinary urgency and pelvic pressure.  Denies vaginal bleeding or contractions.  States she had intercourse yesterday morning.  Endorses good fetal movement.   Factors complicating pregnancy: Rubella and varicella non-immune Anxiety and depression Maternal heart murmur   S: Holding lower abdomen, mild distress, intermittently tearful, unsure of contractions, no VB.no LOF,  Active fetal movement.   Maternal Medical History:  Past Medical Hx:  has a past medical history of Anxiety, Asthma, and Depression.    Past Surgical Hx:  has a past surgical history that includes Dental surgery.   Allergies  Allergen Reactions   Almond (Diagnostic) Anaphylaxis     Prior to Admission medications   Medication Sig Start Date End Date Taking? Authorizing Provider  acetaminophen (TYLENOL) 325 MG tablet Take 650 mg by mouth every 6 (six) hours as needed for moderate pain.   Yes [provider]  sertraline (ZOLOFT) 100 MG tablet Take 100 mg by mouth daily.   Yes [provider]  ibuprofen (ADVIL) 800 MG tablet Take 1 tablet (800 mg total) by mouth every 8 (eight) hours as needed for mild pain. 07/28/21   Ward, Layla Maw, DO  naproxen (NAPROSYN) 500 MG tablet Take 1 tablet (500 mg total) by mouth 2 (two) times daily as needed for moderate pain. 06/26/20   Tommie Sams, DO  oxyCODONE-acetaminophen (PERCOCET/ROXICET) 5-325 MG tablet Take 2 tablets by mouth every 6 (six) hours as needed. 07/28/21   Ward, Layla Maw, DO    Social  History: She  reports that she has been smoking. She has never used smokeless tobacco. She reports current alcohol use. She reports that she does not use drugs.  Family History: family history non-contributory, no history of gyn cancers   Review of Systems: A full review of systems was performed and negative except as noted in the HPI.     Pertinent Results:  Prenatal Labs: Blood type/Rh A pos   Antibody screen Negative    Rubella Non-Immune    Varicella Not immune  RPR NR  HBsAg Neg  Hep C NR   HIV NR  GC neg  Chlamydia neg  Genetic screening cfDNA negative/AFP neg  1 hour GTT Not done yet   3 hour GTT N/A  GBS N/A    O:  BP 112/64 (BP Location: Right Arm)   Pulse 70   Temp 98.2 F (36.8 C) (Oral)   Resp 16   LMP 07/25/2021  Results for orders placed or performed during the hospital encounter of 05/23/22 (from the past 48 hour(s))  Urinalysis, Complete w Microscopic Urine, Clean Catch   Collection Time: 05/23/22  3:19 AM  Result Value Ref Range   Color, Urine STRAW (A) YELLOW   APPearance CLEAR (A) CLEAR   Specific Gravity, Urine 1.008 1.005 - 1.030   pH 6.0 5.0 - 8.0   Glucose, UA NEGATIVE NEGATIVE mg/dL   Hgb urine dipstick MODERATE (A) NEGATIVE   Bilirubin Urine NEGATIVE NEGATIVE   Ketones, ur NEGATIVE NEGATIVE mg/dL   Protein, ur NEGATIVE NEGATIVE mg/dL   Nitrite  NEGATIVE NEGATIVE   Leukocytes,Ua NEGATIVE NEGATIVE   RBC / HPF 6-10 0 - 5 RBC/hpf   WBC, UA 11-20 0 - 5 WBC/hpf   Bacteria, UA RARE (A) NONE SEEN   Squamous Epithelial / LPF 0-5 0 - 5   Mucus PRESENT    Ca Oxalate Crys, UA PRESENT   Wet prep, genital   Collection Time: 05/23/22  3:47 AM   Specimen: Cervical/Vaginal swab  Result Value Ref Range   Yeast Wet Prep HPF POC NONE SEEN NONE SEEN   Trich, Wet Prep NONE SEEN NONE SEEN   Clue Cells Wet Prep HPF POC NONE SEEN NONE SEEN   WBC, Wet Prep HPF POC <10 (A) <10   Sperm NONE SEEN   Chlamydia/NGC rt PCR ([facility] only)   Collection Time: 05/23/22   3:47 AM   Specimen: Cervical/Vaginal swab  Result Value Ref Range   Specimen source GC/Chlam ENDOCERVICAL    Chlamydia Tr NOT DETECTED NOT DETECTED   N gonorrhoeae NOT DETECTED NOT DETECTED  Fetal fibronectin   Collection Time: 05/23/22  3:47 AM  Result Value Ref Range   Fetal Fibronectin NEGATIVE NEGATIVE  CBC   Collection Time: 05/23/22  4:23 AM  Result Value Ref Range   WBC 14.7 (H) 4.0 - 10.5 K/uL   RBC 3.81 (L) 3.87 - 5.11 MIL/uL   Hemoglobin 11.3 (L) 12.0 - 15.0 g/dL   HCT 37.1 (L) 06.2 - 69.4 %   MCV 88.2 80.0 - 100.0 fL   MCH 29.7 26.0 - 34.0 pg   MCHC 33.6 30.0 - 36.0 g/dL   RDW 85.4 62.7 - 03.5 %   Platelets 193 150 - 400 K/uL   nRBC 0.0 0.0 - 0.2 %  Comprehensive metabolic panel   Collection Time: 05/23/22  4:23 AM  Result Value Ref Range   Sodium 136 135 - 145 mmol/L   Potassium 3.5 3.5 - 5.1 mmol/L   Chloride 107 98 - 111 mmol/L   CO2 21 (L) 22 - 32 mmol/L   Glucose, Bld 89 70 - 99 mg/dL   BUN 9 6 - 20 mg/dL   Creatinine, Ser 0.09 0.44 - 1.00 mg/dL   Calcium 9.2 8.9 - 38.1 mg/dL   Total Protein 6.6 6.5 - 8.1 g/dL   Albumin 3.3 (L) 3.5 - 5.0 g/dL   AST 18 15 - 41 U/L   ALT 18 0 - 44 U/L   Alkaline Phosphatase 57 38 - 126 U/L   Total Bilirubin 0.4 0.3 - 1.2 mg/dL   GFR, Estimated >82 >99 mL/min   Anion gap 8 5 - 15     US RENAL  Result Date: 05/23/2022 CLINICAL DATA:  Right-greater-than-left lower abdominal pain for 24 hours. Abdominal pain affecting pregnancy. EXAM: RENAL / URINARY TRACT ULTRASOUND COMPLETE COMPARISON:  MRI abdomen and pelvis 05/23/2022 FINDINGS: Right Kidney: Renal measurements: 12.1 x 6.7 x 8.2 cm = volume: 351 mL. There is again moderate right hydronephrosis. Left Kidney: Renal measurements: 10.1 x 6.5 x 6.3 cm = volume: 214 mL. No left hydronephrosis. Bladder: There is an echogenic focus at the right ureterovesicular junction measuring approximately 3 mm. Associated twinkle artifact. Note is made of upstream moderate ureterectasis  throughout the entire right ureter as seen on today's MRI. The right ureteral jet is visualized. Other: None. IMPRESSION: Moderate right hydronephrosis and moderate right ureterectasis as seen on today's MRI. There appears to be an echogenic obstructing stone within the distal right ureter measuring 3 mm. Electronically Signed  By: Neita Garnet M.D.   On: 05/23/2022 12:01   US OB Limited  Result Date: 05/23/2022 CLINICAL DATA:  Abdominal pain affecting pregnancy. Evaluate amniotic fluid index, cervical length, and placenta. Estimated gestational age [redacted] weeks 6 days based on first ultrasound. Last menstrual period 07/25/2021. EXAM: LIMITED OBSTETRIC ULTRASOUND COMPARISON:  MRI abdomen and pelvis 05/23/2022; limited obstetric ultrasound FINDINGS: Number of Fetuses: 1 Heart Rate:  138 bpm Movement: Yes Presentation: Cephalic Placental Location: Anterior Previa: No Amniotic Fluid (Subjective):  Within normal limits. AFI: 21.7 cm BPD: 7.4 cm 29 w  5 d MATERNAL FINDINGS: Cervix:  Appears closed.  Maternal cervical length: 3.3 cm. Uterus/Adnexae: No abnormality visualized. IMPRESSION: 1. Single live intrauterine gestation with biparietal diameter corresponding to 20 weeks 5 days estimated gestational age, close to the assigned gestational age of [redacted] weeks 6 days based on first ultrasound. 2. Normal maternal cervical length.  The cervix appears closed. 3. Normal amniotic fluid index. 4. Normal appearance of the placenta. 5. No acute abnormality is seen. This exam is performed on an emergent basis and does not comprehensively evaluate fetal size, dating, or anatomy; follow-up complete OB US should be considered if further fetal assessment is warranted. Electronically Signed   By: Neita Garnet M.D.   On: 05/23/2022 11:50   MR ABDOMEN WO CONTRAST  Result Date: 05/23/2022 CLINICAL DATA:  Twenty-eight weeks pregnant with right-sided abdominal pain. EXAM: MRI ABDOMEN AND PELVIS WITHOUT CONTRAST TECHNIQUE: Multiplanar  multisequence MR imaging of the abdomen and pelvis was performed. No intravenous contrast was administered. COMPARISON:  None Available. FINDINGS: COMBINED FINDINGS FOR BOTH MR ABDOMEN AND PELVIS Lower chest: The lung bases are clear.  No pleural effusion. Hepatobiliary: No hepatic lesions or intrahepatic biliary dilatation. The gallbladder is unremarkable. Pancreas:  No mass, inflammation or ductal dilatation. Spleen:  Normal size.  No focal lesions. Adrenals/Urinary Tract:  Adrenal glands are unremarkable. There is moderate right-sided hydroureteronephrosis but no evidence of obstructing ureteral calculus. This is likely due to the gravid uterus. No findings suspicious for pyelonephritis. Stomach/Bowel: The stomach, duodenum, small bowel and colon are unremarkable. No evidence of acute inflammatory process. The terminal ileum and appendix appear normal. Vascular/Lymphatic: The major vascular structures are unremarkable. No adenopathy. Reproductive: Gravid uterus. Cephalic presentation. Subjectively normal amniotic fluid volume. Normal appearing anterior placenta. Both ovaries are normal. Other:  No free abdominal/free pelvic fluid collections. Musculoskeletal: No significant bony findings. IMPRESSION: 1. Moderate right-sided hydroureteronephrosis likely due to the gravid uterus. No evidence of obstructing ureteral calculus. 2. The terminal ileum and appendix appear normal. 3. Gravid uterus. Cephalic presentation with normal amniotic fluid volume. 4. Normal ovaries. Electronically Signed   By: Rudie Meyer M.D.   On: 05/23/2022 08:48   MR PELVIS WO CONTRAST  Result Date: 05/23/2022 CLINICAL DATA:  Twenty-eight weeks pregnant with right-sided abdominal pain. EXAM: MRI ABDOMEN AND PELVIS WITHOUT CONTRAST TECHNIQUE: Multiplanar multisequence MR imaging of the abdomen and pelvis was performed. No intravenous contrast was administered. COMPARISON:  None Available. FINDINGS: COMBINED FINDINGS FOR BOTH MR ABDOMEN AND  PELVIS Lower chest: The lung bases are clear.  No pleural effusion. Hepatobiliary: No hepatic lesions or intrahepatic biliary dilatation. The gallbladder is unremarkable. Pancreas:  No mass, inflammation or ductal dilatation. Spleen:  Normal size.  No focal lesions. Adrenals/Urinary Tract:  Adrenal glands are unremarkable. There is moderate right-sided hydroureteronephrosis but no evidence of obstructing ureteral calculus. This is likely due to the gravid uterus. No findings suspicious for pyelonephritis. Stomach/Bowel: The stomach, duodenum, small bowel  and colon are unremarkable. No evidence of acute inflammatory process. The terminal ileum and appendix appear normal. Vascular/Lymphatic: The major vascular structures are unremarkable. No adenopathy. Reproductive: Gravid uterus. Cephalic presentation. Subjectively normal amniotic fluid volume. Normal appearing anterior placenta. Both ovaries are normal. Other:  No free abdominal/free pelvic fluid collections. Musculoskeletal: No significant bony findings. IMPRESSION: 1. Moderate right-sided hydroureteronephrosis likely due to the gravid uterus. No evidence of obstructing ureteral calculus. 2. The terminal ileum and appendix appear normal. 3. Gravid uterus. Cephalic presentation with normal amniotic fluid volume. 4. Normal ovaries. Electronically Signed   By: Rudie Meyer M.D.   On: 05/23/2022 08:48     Constitutional: NAD, AAOx3  HE/ENT: extraocular movements grossly intact, moist mucous membranes CV: RRR PULM: nl respiratory effort Abd: gravid, tender to palpation bilaterally over lower quadrant with R>L Ext: Non-tender, Nonedmeatous Psych: mood appropriate, speech normal Pelvic :  Mild erythema present, moderate amount of physiologic and thick white discharge present  SVE: Dilation: Closed Effacement (%): Thick Exam by:: Margaretmary Eddy CNM   Fetal Monitor: Baseline: 130 bpm Variability: moderate Accels: Present Decels: none Toco: Irregular, mild  contractions, ranging from 2-7 min Time: at least 20 min  Category: I NST: Reactive, appropriate for gestational age    Assessment: 26 y.o. [redacted]w[redacted]d here for antenatal surveillance during pregnancy.  Principle diagnosis: Renal calculi, abdominal pain in pregnancy   Plan: Preterm labor not present  FFN, Wet prep, and GC/CT negative  NST reviewed - reactive tracing, appropriate for gestational age  Fetal Wellbeing: Reassuring Cat 1 tracing. MRI negative for appendicitis  Distal kidney stone seen on renal US - Dr. Richardo Hanks with Urology consulted.  He reviewed images.  Stone is likely to spontaneously pass.   Will d/c home with pain management and f/u in 1 week with Dr. Richardo Hanks   Allergies as of 05/23/2022       Reactions   Almond (diagnostic) Anaphylaxis        Medication List     STOP taking these medications    ibuprofen 800 MG tablet Commonly known as: ADVIL   naproxen 500 MG tablet Commonly known as: NAPROSYN   oxyCODONE-acetaminophen 5-325 MG tablet Commonly known as: PERCOCET/ROXICET       TAKE these medications    acetaminophen 500 MG tablet Commonly known as: TYLENOL Take 2 tablets (1,000 mg total) by mouth every 6 (six) hours as needed (for pain scale < 4  OR  temperature  >/=  100.5 F). What changed:  medication strength how much to take reasons to take this   oxyCODONE 5 MG immediate release tablet Commonly known as: Roxicodone Take 1 tablet (5 mg total) by mouth every 4 (four) hours as needed for up to 5 days for moderate pain or severe pain.   sertraline 100 MG tablet Commonly known as: ZOLOFT Take 100 mg by mouth daily.         Follow-up Information     Sondra Come, MD. Schedule an appointment as soon as possible for a visit in 1 week(s).   Specialty: Urology Why: follow up for kidney stone Contact information: [address] [place] [address] [phone]         [facility]. Schedule an appointment as soon  as possible for a visit in 1 week(s).   Why: follow up prenatal visit Contact information: [address] [place] [address] [phone]                 -----  Drinda Butts, CNM Certified Nurse Midwife [facility]

## 2022-05-24 LAB — URINE CULTURE: Culture: NO GROWTH

## 2022-05-29 ENCOUNTER — Ambulatory Visit: Payer: Medicaid Other | Admitting: Urology

## 2022-06-04 ENCOUNTER — Other Ambulatory Visit: Payer: Self-pay | Admitting: *Deleted

## 2022-06-04 ENCOUNTER — Ambulatory Visit: Payer: Medicaid Other | Admitting: Urology

## 2022-06-04 DIAGNOSIS — N200 Calculus of kidney: Secondary | ICD-10-CM

## 2022-07-16 LAB — OB RESULTS CONSOLE RPR: RPR: NONREACTIVE

## 2022-07-16 LAB — OB RESULTS CONSOLE HIV ANTIBODY (ROUTINE TESTING): HIV: NONREACTIVE

## 2022-07-16 LAB — OB RESULTS CONSOLE GBS: GBS: NEGATIVE

## 2022-07-16 LAB — OB RESULTS CONSOLE GC/CHLAMYDIA
Chlamydia: NEGATIVE
Neisseria Gonorrhea: NEGATIVE

## 2022-07-31 ENCOUNTER — Other Ambulatory Visit: Payer: Self-pay

## 2022-07-31 DIAGNOSIS — Z8709 Personal history of other diseases of the respiratory system: Secondary | ICD-10-CM | POA: Insufficient documentation

## 2022-07-31 DIAGNOSIS — R011 Cardiac murmur, unspecified: Secondary | ICD-10-CM

## 2022-07-31 DIAGNOSIS — Z2839 Other underimmunization status: Secondary | ICD-10-CM | POA: Insufficient documentation

## 2022-07-31 DIAGNOSIS — F411 Generalized anxiety disorder: Secondary | ICD-10-CM

## 2022-07-31 DIAGNOSIS — Z3490 Encounter for supervision of normal pregnancy, unspecified, unspecified trimester: Secondary | ICD-10-CM

## 2022-07-31 NOTE — Progress Notes (Signed)
G1P0 at [redacted]w[redacted]d, Patient's last menstrual period was 10/26/2021 (exact date)., not c/w early [place] at [redacted]w[redacted]d.  Scheduled for induction of labor for elective at term on 08/06/2022 at 0001.   Prenatal provider: [facility] Pregnancy complicated by: 1. Encounter for induction of labor   2. Rubella non-immune status, antepartum   3. Maternal varicella, non-immune   4. Generalized anxiety disorder   5. History of asthma   6. Flow murmur      Prenatal Labs: Blood type/Rh A  Antibody screen neg  Rubella Nonimmune (04/27 0000)   Varicella Non-Immune  RPR NR  HBsAg Negative (04/27 0000)   Hep C NR  HIV Non-reactive (11/07 0000)   GC neg  Chlamydia neg  Genetic screening cfDNA negative/AFP neg  1 hour GTT 129  3 hour GTT N/A  GBS Negative/-- (11/07 0000)    Tdap: declined  Flu: declined  Contraception: TBD Feeding preference: TBD  ____ Margaretmary Eddy, CNM Certified Nurse Midwife [facility]

## 2022-08-05 ENCOUNTER — Other Ambulatory Visit: Payer: Self-pay

## 2022-08-05 DIAGNOSIS — O9081 Anemia of the puerperium: Secondary | ICD-10-CM | POA: Diagnosis not present

## 2022-08-05 DIAGNOSIS — Z3A39 39 weeks gestation of pregnancy: Secondary | ICD-10-CM

## 2022-08-05 DIAGNOSIS — O09899 Supervision of other high risk pregnancies, unspecified trimester: Secondary | ICD-10-CM

## 2022-08-05 DIAGNOSIS — F419 Anxiety disorder, unspecified: Secondary | ICD-10-CM | POA: Insufficient documentation

## 2022-08-05 DIAGNOSIS — O134 Gestational [pregnancy-induced] hypertension without significant proteinuria, complicating childbirth: Principal | ICD-10-CM | POA: Diagnosis present

## 2022-08-05 DIAGNOSIS — D62 Acute posthemorrhagic anemia: Secondary | ICD-10-CM | POA: Diagnosis not present

## 2022-08-05 DIAGNOSIS — O99344 Other mental disorders complicating childbirth: Secondary | ICD-10-CM | POA: Diagnosis present

## 2022-08-05 DIAGNOSIS — Z23 Encounter for immunization: Secondary | ICD-10-CM

## 2022-08-05 DIAGNOSIS — O34219 Maternal care for unspecified type scar from previous cesarean delivery: Secondary | ICD-10-CM | POA: Diagnosis present

## 2022-08-05 DIAGNOSIS — F411 Generalized anxiety disorder: Secondary | ICD-10-CM | POA: Diagnosis present

## 2022-08-05 DIAGNOSIS — O9952 Diseases of the respiratory system complicating childbirth: Secondary | ICD-10-CM | POA: Diagnosis present

## 2022-08-05 DIAGNOSIS — J45909 Unspecified asthma, uncomplicated: Secondary | ICD-10-CM | POA: Diagnosis present

## 2022-08-05 DIAGNOSIS — O99334 Smoking (tobacco) complicating childbirth: Secondary | ICD-10-CM | POA: Diagnosis present

## 2022-08-05 DIAGNOSIS — F32A Depression, unspecified: Secondary | ICD-10-CM | POA: Insufficient documentation

## 2022-08-05 DIAGNOSIS — Z3490 Encounter for supervision of normal pregnancy, unspecified, unspecified trimester: Secondary | ICD-10-CM | POA: Insufficient documentation

## 2022-08-05 DIAGNOSIS — Z2839 Other underimmunization status: Secondary | ICD-10-CM

## 2022-08-05 DIAGNOSIS — R011 Cardiac murmur, unspecified: Secondary | ICD-10-CM

## 2022-08-05 DIAGNOSIS — J4520 Mild intermittent asthma, uncomplicated: Secondary | ICD-10-CM

## 2022-08-05 DIAGNOSIS — F17200 Nicotine dependence, unspecified, uncomplicated: Secondary | ICD-10-CM | POA: Diagnosis present

## 2022-08-05 NOTE — Progress Notes (Deleted)
G1P0 at [redacted]w[redacted]d, Patient's last menstrual period was 10/26/2021 (exact date)., not c/w early [place] at [redacted]w[redacted]d.  Scheduled for induction of labor for elective at term on 08/06/2022 at 0001.   Prenatal provider: [facility] Pregnancy complicated by: 1. Encounter for elective induction of labor   2. Maternal varicella, non-immune   3. Rubella non-immune status, antepartum   4. Flow murmur   5. Mild intermittent asthma without complication   6. Depression affecting pregnancy   7. Anxiety during pregnancy      Prenatal Labs: Blood type/Rh A  Antibody screen neg  Rubella Nonimmune (04/27 0000)   Varicella Non-Immune  RPR NR  HBsAg Negative (04/27 0000)   Hep C NR  HIV Non-reactive (11/07 0000)   GC neg  Chlamydia neg  Genetic screening cfDNA negative/AFP neg   1 hour GTT 129  3 hour GTT N/A   GBS Negative/-- (11/07 0000)    Tdap: declined  Flu: declined  Contraception:  TBD Feeding preference: TBD  ____ Margaretmary Eddy, CNM Certified Nurse Midwife [facility]

## 2022-08-06 ENCOUNTER — Inpatient Hospital Stay
Admission: EM | Admit: 2022-08-06 | Discharge: 2022-08-09 | DRG: 806 | Disposition: A | Discharge status: Home / Self Care | Payer: Medicaid Other | Attending: Obstetrics | Admitting: Obstetrics

## 2022-08-06 ENCOUNTER — Encounter: Payer: Self-pay | Admitting: Obstetrics and Gynecology

## 2022-08-06 ENCOUNTER — Other Ambulatory Visit: Payer: Self-pay

## 2022-08-06 DIAGNOSIS — F411 Generalized anxiety disorder: Secondary | ICD-10-CM | POA: Diagnosis present

## 2022-08-06 DIAGNOSIS — O34219 Maternal care for unspecified type scar from previous cesarean delivery: Secondary | ICD-10-CM | POA: Diagnosis present

## 2022-08-06 DIAGNOSIS — F32A Depression, unspecified: Secondary | ICD-10-CM | POA: Diagnosis present

## 2022-08-06 DIAGNOSIS — O9081 Anemia of the puerperium: Secondary | ICD-10-CM | POA: Diagnosis not present

## 2022-08-06 DIAGNOSIS — O99344 Other mental disorders complicating childbirth: Secondary | ICD-10-CM | POA: Diagnosis present

## 2022-08-06 DIAGNOSIS — O134 Gestational [pregnancy-induced] hypertension without significant proteinuria, complicating childbirth: Secondary | ICD-10-CM | POA: Diagnosis present

## 2022-08-06 DIAGNOSIS — O139 Gestational [pregnancy-induced] hypertension without significant proteinuria, unspecified trimester: Secondary | ICD-10-CM | POA: Diagnosis not present

## 2022-08-06 DIAGNOSIS — Z2839 Other underimmunization status: Secondary | ICD-10-CM

## 2022-08-06 DIAGNOSIS — J4520 Mild intermittent asthma, uncomplicated: Secondary | ICD-10-CM | POA: Diagnosis present

## 2022-08-06 DIAGNOSIS — Z23 Encounter for immunization: Secondary | ICD-10-CM | POA: Diagnosis not present

## 2022-08-06 DIAGNOSIS — D62 Acute posthemorrhagic anemia: Secondary | ICD-10-CM | POA: Diagnosis not present

## 2022-08-06 DIAGNOSIS — O9952 Diseases of the respiratory system complicating childbirth: Secondary | ICD-10-CM | POA: Diagnosis present

## 2022-08-06 DIAGNOSIS — O99334 Smoking (tobacco) complicating childbirth: Secondary | ICD-10-CM | POA: Diagnosis present

## 2022-08-06 DIAGNOSIS — O26893 Other specified pregnancy related conditions, third trimester: Secondary | ICD-10-CM | POA: Diagnosis present

## 2022-08-06 DIAGNOSIS — F419 Anxiety disorder, unspecified: Secondary | ICD-10-CM | POA: Diagnosis present

## 2022-08-06 DIAGNOSIS — Z3490 Encounter for supervision of normal pregnancy, unspecified, unspecified trimester: Secondary | ICD-10-CM | POA: Diagnosis present

## 2022-08-06 DIAGNOSIS — O09899 Supervision of other high risk pregnancies, unspecified trimester: Secondary | ICD-10-CM

## 2022-08-06 DIAGNOSIS — F17200 Nicotine dependence, unspecified, uncomplicated: Secondary | ICD-10-CM | POA: Diagnosis present

## 2022-08-06 DIAGNOSIS — O99340 Other mental disorders complicating pregnancy, unspecified trimester: Secondary | ICD-10-CM | POA: Diagnosis present

## 2022-08-06 DIAGNOSIS — Z3A39 39 weeks gestation of pregnancy: Secondary | ICD-10-CM | POA: Diagnosis not present

## 2022-08-06 DIAGNOSIS — J45909 Unspecified asthma, uncomplicated: Secondary | ICD-10-CM | POA: Diagnosis present

## 2022-08-06 HISTORY — DX: Gestational (pregnancy-induced) hypertension without significant proteinuria, unspecified trimester: O13.9

## 2022-08-06 LAB — COMPREHENSIVE METABOLIC PANEL
ALT: 14 U/L (ref 0–44)
AST: 22 U/L (ref 15–41)
Albumin: 3 g/dL — ABNORMAL LOW (ref 3.5–5.0)
Alkaline Phosphatase: 127 U/L — ABNORMAL HIGH (ref 38–126)
Anion gap: 8 (ref 5–15)
BUN: 11 mg/dL (ref 6–20)
CO2: 21 mmol/L — ABNORMAL LOW (ref 22–32)
Calcium: 8.9 mg/dL (ref 8.9–10.3)
Chloride: 106 mmol/L (ref 98–111)
Creatinine, Ser: 0.52 mg/dL (ref 0.44–1.00)
GFR, Estimated: >60 mL/min (ref >60)
Glucose, Bld: 119 mg/dL — ABNORMAL HIGH (ref 70–99)
Potassium: 3.3 mmol/L — ABNORMAL LOW (ref 3.5–5.1)
Sodium: 135 mmol/L (ref 135–145)
Total Bilirubin: 0.4 mg/dL (ref 0.3–1.2)
Total Protein: 6.5 g/dL (ref 6.5–8.1)

## 2022-08-06 LAB — CBC
HCT: 31.3 % — ABNORMAL LOW (ref 36.0–46.0)
Hemoglobin: 10.9 g/dL — ABNORMAL LOW (ref 12.0–15.0)
MCH: 30.3 pg (ref 26.0–34.0)
MCHC: 34.8 g/dL (ref 30.0–36.0)
MCV: 86.9 fL (ref 80.0–100.0)
Platelets: 280 10*3/uL (ref 150–400)
RBC: 3.6 MIL/uL — ABNORMAL LOW (ref 3.87–5.11)
RDW: 12.7 % (ref 11.5–15.5)
WBC: 11.5 10*3/uL — ABNORMAL HIGH (ref 4.0–10.5)
nRBC: 0 % (ref 0.0–0.2)

## 2022-08-06 LAB — TYPE AND SCREEN
ABO/RH(D): A POS
Antibody Screen: NEGATIVE

## 2022-08-06 LAB — ABO/RH: ABO/RH(D): A POS

## 2022-08-06 LAB — PROTEIN / CREATININE RATIO, URINE
Creatinine, Urine: 59 mg/dL
Total Protein, Urine: <6 mg/dL

## 2022-08-06 LAB — RPR: RPR Ser Ql: NONREACTIVE

## 2022-08-06 MED ORDER — MISOPROSTOL 25 MCG QUARTER TABLET
25.0000 ug | ORAL_TABLET | ORAL | Status: AC
  Administered 2022-08-06: 25 ug via ORAL
  Filled 2022-08-06 (×2): qty 1

## 2022-08-06 MED ORDER — MISOPROSTOL 25 MCG QUARTER TABLET
ORAL_TABLET | ORAL | Status: AC
  Administered 2022-08-06: 25 ug via ORAL
  Filled 2022-08-06: qty 1

## 2022-08-06 MED ORDER — ACETAMINOPHEN 500 MG PO TABS: 1000.0000 mg | ORAL_TABLET | Freq: Four times a day (QID) | ORAL | Status: DC | PRN

## 2022-08-06 MED ORDER — ONDANSETRON HCL 4 MG/2ML IJ SOLN
4.0000 mg | Freq: Four times a day (QID) | INTRAMUSCULAR | Status: DC | PRN
  Administered 2022-08-07: 4 mg via INTRAVENOUS
  Filled 2022-08-06: qty 2

## 2022-08-06 MED ORDER — SERTRALINE HCL 100 MG PO TABS
100.0000 mg | ORAL_TABLET | Freq: Every day | ORAL | Status: DC
  Administered 2022-08-06: 100 mg via ORAL
  Filled 2022-08-06 (×3): qty 1

## 2022-08-06 MED ORDER — MISOPROSTOL 25 MCG QUARTER TABLET: 25.0000 ug | ORAL_TABLET | Freq: Once | ORAL | Status: AC

## 2022-08-06 MED ORDER — LACTATED RINGERS IV SOLN: INTRAVENOUS | Status: DC

## 2022-08-06 MED ORDER — FENTANYL CITRATE (PF) 100 MCG/2ML IJ SOLN
50.0000 ug | INTRAMUSCULAR | Status: DC | PRN
  Administered 2022-08-06 – 2022-08-07 (×3): 100 ug via INTRAVENOUS
  Filled 2022-08-06 (×3): qty 2

## 2022-08-06 MED ORDER — LIDOCAINE HCL (PF) 1 % IJ SOLN
INTRAMUSCULAR | Status: AC
  Filled 2022-08-06: qty 30

## 2022-08-06 MED ORDER — SODIUM CHLORIDE 0.9% FLUSH: 3.0000 mL | Freq: Two times a day (BID) | INTRAVENOUS | Status: DC

## 2022-08-06 MED ORDER — MISOPROSTOL 25 MCG QUARTER TABLET
25.0000 ug | ORAL_TABLET | ORAL | Status: AC
  Administered 2022-08-06 (×3): 25 ug via VAGINAL
  Filled 2022-08-06 (×2): qty 1

## 2022-08-06 MED ORDER — SODIUM CHLORIDE 0.9 % IV SOLN: 250.0000 mL | INTRAVENOUS | Status: DC | PRN

## 2022-08-06 MED ORDER — OXYTOCIN-SODIUM CHLORIDE 30-0.9 UT/500ML-% IV SOLN
2.5000 [IU]/h | INTRAVENOUS | Status: DC
  Filled 2022-08-06: qty 500

## 2022-08-06 MED ORDER — SODIUM CHLORIDE 0.9% FLUSH: 3.0000 mL | INTRAVENOUS | Status: DC | PRN

## 2022-08-06 MED ORDER — LIDOCAINE HCL (PF) 1 % IJ SOLN
30.0000 mL | INTRAMUSCULAR | Status: AC | PRN
  Administered 2022-08-07: 30 mL via SUBCUTANEOUS

## 2022-08-06 MED ORDER — SOD CITRATE-CITRIC ACID 500-334 MG/5ML PO SOLN: 30.0000 mL | ORAL | Status: DC | PRN

## 2022-08-06 MED ORDER — AMMONIA AROMATIC IN INHA
RESPIRATORY_TRACT | Status: AC
  Filled 2022-08-06: qty 10

## 2022-08-06 MED ORDER — CALCIUM CARBONATE ANTACID 500 MG PO CHEW
400.0000 mg | CHEWABLE_TABLET | Freq: Three times a day (TID) | ORAL | Status: DC | PRN
  Administered 2022-08-07: 400 mg via ORAL
  Filled 2022-08-06: qty 2

## 2022-08-06 MED ORDER — OXYTOCIN BOLUS FROM INFUSION
333.0000 mL | Freq: Once | INTRAVENOUS | Status: AC
  Administered 2022-08-07: 333 mL via INTRAVENOUS

## 2022-08-06 MED ORDER — TERBUTALINE SULFATE 1 MG/ML IJ SOLN: 0.2500 mg | Freq: Once | INTRAMUSCULAR | Status: DC | PRN

## 2022-08-06 MED ORDER — OXYTOCIN-SODIUM CHLORIDE 30-0.9 UT/500ML-% IV SOLN
1.0000 m[IU]/min | INTRAVENOUS | Status: DC
  Administered 2022-08-07: 2 m[IU]/min via INTRAVENOUS
  Administered 2022-08-07: 10 m[IU]/min via INTRAVENOUS

## 2022-08-06 MED ORDER — MISOPROSTOL 200 MCG PO TABS
ORAL_TABLET | ORAL | Status: AC
  Filled 2022-08-06: qty 4

## 2022-08-06 MED ORDER — LACTATED RINGERS IV SOLN
500.0000 mL | INTRAVENOUS | Status: DC | PRN
  Administered 2022-08-06: 500 mL via INTRAVENOUS

## 2022-08-06 MED ORDER — SERTRALINE HCL 100 MG PO TABS
100.0000 mg | ORAL_TABLET | Freq: Once | ORAL | Status: AC
  Administered 2022-08-06: 100 mg via ORAL
  Filled 2022-08-06: qty 1

## 2022-08-06 NOTE — H&P (Signed)
OB History & Physical   History of Present Illness:  Chief Complaint:   HPI:  Jaclyn Patterson is a 26 y.o. G1P0 female at [redacted]w[redacted]d dated by [redacted]w[redacted]d u/s.  She presents to L&D for a scheduled elective IOL.  She reports:  -active fetal movement -no leakage of fluid -no vaginal bleeding -no contractions  Pregnancy Issues: Rubella non-immune status, antepartum  Maternal varicella, non-immune  Generalized anxiety disorder  History of asthma Flow murmur    Maternal Medical History:   Past Medical History:  Diagnosis Date   Anxiety    Asthma    Chronic pelvic pain in female 09/19/2017   Last Assessment & Plan:   Formatting of this note might be different from the original.  Ongoing if intermittent issue for pt with unclear etiology. Cyst thought to be cause of RLQ pain now absent per US done in ED but with new LLQ pain which is consistent with finding of corpus luteum or hemorrhagic ovarian cyst found on [place] from 10/19/17, however unclear at this time if this fully explains pt's pa   Depression     Past Surgical History:  Procedure Laterality Date   DENTAL SURGERY      Allergies  Allergen Reactions   Almond (Diagnostic) Anaphylaxis    Prior to Admission medications   Medication Sig Start Date End Date Taking? Authorizing Provider  acetaminophen (TYLENOL) 500 MG tablet Take 2 tablets (1,000 mg total) by mouth every 6 (six) hours as needed (for pain scale < 4  OR  temperature  >/=  100.5 F). 05/23/22   Gustavo Lah, CNM  ondansetron (ZOFRAN) 4 MG tablet Take 4 mg by mouth every 8 (eight) hours as needed for nausea. 06/10/22   [provider]  Prenatal MV-Min-Fe Fum-FA-DHA (PRENATAL 1 PO) Take 1 tablet by mouth daily.    [provider]  promethazine (PHENERGAN) 12.5 MG tablet Take 12.5 mg by mouth every 6 (six) hours as needed for nausea. 06/24/22   [provider]  sertraline (ZOLOFT) 100 MG tablet Take 100 mg by mouth daily.    [provider]   VENTOLIN HFA 108 (90 Base) MCG/ACT inhaler Inhale 1-2 puffs into the lungs every 4 (four) hours as needed for wheezing.    [provider]     Prenatal care site: [facility]   Social History: She  reports that she has been smoking. She has never used smokeless tobacco. She reports current alcohol use. She reports that she does not use drugs.  Family History: family history includes Diabetes in her maternal grandmother.   Review of Systems: A full review of systems was performed and negative except as noted in the HPI.    Physical Exam:  Vital Signs: LMP 10/26/2021 (Exact Date)   General:   alert and cooperative  Skin:  normal  Neurologic:    Alert & oriented x 3  Lungs:    Nl effort  Heart:   regular rate and rhythm  Abdomen:  soft, non-tender; bowel sounds normal; no masses,  no organomegaly  Extremities: : non-tender, symmetric, no edema bilaterally.       Results for orders placed or performed during the hospital encounter of 08/06/22 (from the past 24 hour(s))  CBC     Status: Abnormal   Collection Time: 08/06/22 12:52 AM  Result Value Ref Range   WBC 11.5 (H) 4.0 - 10.5 K/uL   RBC 3.60 (L) 3.87 - 5.11 MIL/uL   Hemoglobin 10.9 (L) 12.0 -  15.0 g/dL   HCT 62.8 (L) 31.5 - 17.6 %   MCV 86.9 80.0 - 100.0 fL   MCH 30.3 26.0 - 34.0 pg   MCHC 34.8 30.0 - 36.0 g/dL   RDW 16.0 73.7 - 10.6 %   Platelets 280 150 - 400 K/uL   nRBC 0.0 0.0 - 0.2 %  Type and screen     Status: None   Collection Time: 08/06/22 12:52 AM  Result Value Ref Range   ABO/RH(D) A POS    Antibody Screen NEG    Sample Expiration      08/09/2022,2359 Performed at [facility], [address], [place], [place] [address]     Pertinent Results:  Prenatal Labs: Blood type/Rh A pos  Antibody screen neg  Rubella Nonimmune (04/27 0000)   Varicella Non-Immune  RPR NR  HBsAg Negative (04/27 0000)   Hep C NR  HIV Non-reactive (11/07 0000)   GC neg  Chlamydia neg  Genetic  screening cfDNA negative/AFP neg  1 hour GTT 129  3 hour GTT N/A  GBS Negative/-- (11/07 0000)    FHT: FHR: 125 bpm, variability: moderate,  accelerations:  Present,  decelerations:  Absent Category/reactivity:  Category I TOCO: regular, every 2-3 minutes SVE: Dilation: Closed / Effacement (%): 50 / Station: Ballotable      Assessment:  Jaclyn Patterson is a 26 y.o. G1P0 female at [redacted]w[redacted]d with a scheduled elective IOL.   Plan:  1. Admit to Labor & Delivery; consents reviewed and obtained  2. Fetal Well being  - Fetal Tracing: Cat I - GBS neg - Presentation: vtx confirmed by sve   3. Routine OB: - Prenatal labs reviewed, as above - Rh pos - CBC & T&S on admit - Clear fluids, IVF or saline lock  4. Induction of Labor -  Contractions by external toco in place -  Plan for induction with cytotec and pitocin -  Plan for continuous fetal monitoring  -  Maternal pain control as desired: IVPM, nitrous, regional anesthesia - Anticipate vaginal delivery  5. Post Partum Planning: - Tdap: declined  - Flu: declined  - Contraception: TBD - Feeding preference: TBD  Jeriah Corkum, CNM 08/06/2022 6:00 AM

## 2022-08-06 NOTE — Progress Notes (Addendum)
L&D Note    Subjective:  Feeling cramping but contractions do not feel worse   Objective:   Vitals:   08/06/22 0853 08/06/22 1338 08/06/22 1340 08/06/22 1521  BP: 129/81  (!) 144/87 127/74  Pulse: 66  63 71  Resp: 16 16    Temp: (!) 97.5 F (36.4 C) 97.8 F (36.6 C)    TempSrc: Oral Oral    SpO2: 100%     Weight: 77.1 kg     Height: 5' (1.524 m)       Current Vital Signs 24h Vital Sign Ranges  T 97.8 F (36.6 C) Temp  Avg: 98 F (36.7 C)  Min: 97.5 F (36.4 C)  Max: 98.4 F (36.9 C)  BP 127/74 BP  Min: 123/72  Max: 148/87  HR 71 Pulse  Avg: 73.6  Min: 63  Max: 98  RR 16 Resp  Avg: 15.5  Min: 14  Max: 16  SaO2 100 % Room Air SpO2  Avg: 100 %  Min: 100 %  Max: 100 %      Gen: alert, cooperative, no distress FHR: Baseline: 130 bpm, Variability: moderate, Accels: Present, Decels: none Toco: irregular, mild contractions SVE: 1/50/-2/soft/mid  Medications SCHEDULED MEDICATIONS   misoprostol       oxytocin 40 units in LR 1000 mL  333 mL Intravenous Once   sertraline  100 mg Oral QHS   sodium chloride flush  3 mL Intravenous Q12H    MEDICATION INFUSIONS   sodium chloride     lactated ringers 500 mL (08/06/22 0057)   lactated ringers 125 mL/hr at 08/06/22 0130   oxytocin     oxytocin      PRN MEDICATIONS  misoprostol, sodium chloride, acetaminophen, calcium carbonate, fentaNYL (SUBLIMAZE) injection, lactated ringers, lidocaine (PF), ondansetron, sodium chloride flush, sodium citrate-citric acid, terbutaline   Assessment & Plan:  26 y.o. G1P0 at [redacted]w[redacted]d admitted for elective IOL at term -Labor: Cervical ripening with misoprostol and cervical balloon -Received vaginal and oral misoprostol followed by 2 doses of vaginal misoprostol  -Cervical balloon placed with 30 ml saline -1 dose of oral misoprostol give with cervical balloon -Gestational hypertension - several mild range BP's, now meets criteria for gestational hypertension  -Fetal Well-being: Category I -GBS:  negative -Analgesia: position changes   Gustavo Lah, CNM  08/06/2022 10:24 PM  [facility]

## 2022-08-06 NOTE — Progress Notes (Signed)
L&D Note    Subjective:  Feeling cramping  Objective:   Vitals:   08/06/22 0332 08/06/22 0449 08/06/22 0717 08/06/22 0853  BP: (!) 134/92 123/72 (!) 140/92 129/81  Pulse: 74 69 74 66  Resp:  14  16  Temp:  98.4 F (36.9 C)  (!) 97.5 F (36.4 C)  TempSrc:  Oral  Oral  SpO2:    100%  Weight:    77.1 kg  Height:    5' (1.524 m)    Current Vital Signs 24h Vital Sign Ranges  T (!) 97.5 F (36.4 C) Temp  Avg: 98 F (36.7 C)  Min: 97.5 F (36.4 C)  Max: 98.4 F (36.9 C)  BP 129/81 BP  Min: 123/72  Max: 148/87  HR 66 Pulse  Avg: 76.2  Min: 66  Max: 98  RR 16 Resp  Avg: 15.3  Min: 14  Max: 16  SaO2 100 % Room Air SpO2  Avg: 100 %  Min: 100 %  Max: 100 %      Gen: alert, cooperative, no distress FHR: Baseline: 130 bpm, Variability: moderate, Accels: Present, Decels: none Toco: irregular contractions every 1-6 minutes, mild to palpation SVE: Dilation: Fingertip Effacement (%): 70 Cervical Position: Posterior, Middle Station: -2 Presentation: Vertex Exam by:: Margaretmary Eddy CNM  Medications SCHEDULED MEDICATIONS   ammonia       lidocaine (PF)       misoprostol       misoprostol  25 mcg Oral Q4H   And   misoprostol  25 mcg Vaginal Q4H   oxytocin 40 units in LR 1000 mL  333 mL Intravenous Once   sertraline  100 mg Oral QHS   sodium chloride flush  3 mL Intravenous Q12H    MEDICATION INFUSIONS   sodium chloride     lactated ringers 500 mL (08/06/22 0057)   lactated ringers 125 mL/hr at 08/06/22 0130   oxytocin     oxytocin      PRN MEDICATIONS  sodium chloride, acetaminophen, ammonia, calcium carbonate, fentaNYL (SUBLIMAZE) injection, lactated ringers, lidocaine (PF), lidocaine (PF), misoprostol, ondansetron, sodium chloride flush, sodium citrate-citric acid, terbutaline   Assessment & Plan:  26 y.o. G1P0 at [redacted]w[redacted]d admitted for elective IOL at term -Labor: Cervical ripening with misoprostol  --s/p 1 dose of vaginal and oral misoprostol, 2nd dose of vaginal misoprostol  given now -Occasional mild range BP's noted - Urine PCR negative, CMP WNL  -Fetal Well-being: Category I -GBS: negative -Analgesia: position changes   Gustavo Lah, CNM  08/06/2022 10:18 AM  [facility]

## 2022-08-07 ENCOUNTER — Inpatient Hospital Stay: Payer: Medicaid Other | Admitting: Anesthesiology

## 2022-08-07 ENCOUNTER — Encounter: Payer: Self-pay | Admitting: Obstetrics and Gynecology

## 2022-08-07 MED ORDER — DIPHENHYDRAMINE HCL 25 MG PO CAPS: 25.0000 mg | ORAL_CAPSULE | Freq: Four times a day (QID) | ORAL | Status: DC | PRN

## 2022-08-07 MED ORDER — SODIUM CHLORIDE 0.9 % IV SOLN: 250.0000 mL | INTRAVENOUS | Status: DC | PRN

## 2022-08-07 MED ORDER — BUPIVACAINE HCL (PF) 0.25 % IJ SOLN
INTRAMUSCULAR | Status: DC | PRN
  Administered 2022-08-07: 3 mL via EPIDURAL

## 2022-08-07 MED ORDER — LIDOCAINE HCL (PF) 1 % IJ SOLN
INTRAMUSCULAR | Status: DC | PRN
  Administered 2022-08-07: 3 mL

## 2022-08-07 MED ORDER — ONDANSETRON HCL 4 MG PO TABS: 4.0000 mg | ORAL_TABLET | ORAL | Status: DC | PRN

## 2022-08-07 MED ORDER — ZOLPIDEM TARTRATE 5 MG PO TABS: 5.0000 mg | ORAL_TABLET | Freq: Every evening | ORAL | Status: DC | PRN

## 2022-08-07 MED ORDER — DIPHENHYDRAMINE HCL 50 MG/ML IJ SOLN: 12.5000 mg | INTRAMUSCULAR | Status: DC | PRN

## 2022-08-07 MED ORDER — PHENYLEPHRINE 80 MCG/ML (10ML) SYRINGE FOR IV PUSH (FOR BLOOD PRESSURE SUPPORT): 80.0000 ug | PREFILLED_SYRINGE | INTRAVENOUS | Status: DC | PRN

## 2022-08-07 MED ORDER — ALBUTEROL SULFATE HFA 108 (90 BASE) MCG/ACT IN AERS: 1.0000 | INHALATION_SPRAY | RESPIRATORY_TRACT | Status: DC | PRN

## 2022-08-07 MED ORDER — SODIUM CHLORIDE 0.9% FLUSH: 3.0000 mL | Freq: Two times a day (BID) | INTRAVENOUS | Status: DC

## 2022-08-07 MED ORDER — COCONUT OIL OIL: 1.0000 | TOPICAL_OIL | Status: DC | PRN

## 2022-08-07 MED ORDER — SODIUM CHLORIDE 0.9% FLUSH: 3.0000 mL | INTRAVENOUS | Status: DC | PRN

## 2022-08-07 MED ORDER — BENZOCAINE-MENTHOL 20-0.5 % EX AERO
1.0000 | INHALATION_SPRAY | CUTANEOUS | Status: DC | PRN
  Administered 2022-08-08: 1 via TOPICAL
  Filled 2022-08-07 (×2): qty 56

## 2022-08-07 MED ORDER — MEASLES, MUMPS & RUBELLA VAC IJ SOLR
0.5000 mL | Freq: Once | INTRAMUSCULAR | Status: AC
  Administered 2022-08-08: 0.5 mL via SUBCUTANEOUS
  Filled 2022-08-07: qty 0.5

## 2022-08-07 MED ORDER — TETANUS-DIPHTH-ACELL PERTUSSIS 5-2.5-18.5 LF-MCG/0.5 IM SUSY: 0.5000 mL | PREFILLED_SYRINGE | Freq: Once | INTRAMUSCULAR | Status: DC

## 2022-08-07 MED ORDER — EPHEDRINE 5 MG/ML INJ: 10.0000 mg | INTRAVENOUS | Status: DC | PRN

## 2022-08-07 MED ORDER — SIMETHICONE 80 MG PO CHEW: 80.0000 mg | CHEWABLE_TABLET | ORAL | Status: DC | PRN

## 2022-08-07 MED ORDER — SERTRALINE HCL 100 MG PO TABS
100.0000 mg | ORAL_TABLET | Freq: Every day | ORAL | Status: DC
  Administered 2022-08-07 – 2022-08-08 (×2): 100 mg via ORAL
  Filled 2022-08-07 (×4): qty 1

## 2022-08-07 MED ORDER — LIDOCAINE-EPINEPHRINE (PF) 1.5 %-1:200000 IJ SOLN
INTRAMUSCULAR | Status: DC | PRN
  Administered 2022-08-07: 3 mL via PERINEURAL

## 2022-08-07 MED ORDER — BISACODYL 10 MG RE SUPP: 10.0000 mg | Freq: Every day | RECTAL | Status: DC | PRN

## 2022-08-07 MED ORDER — FENTANYL-BUPIVACAINE-NACL 0.5-0.125-0.9 MG/250ML-% EP SOLN
EPIDURAL | Status: AC
  Filled 2022-08-07: qty 250

## 2022-08-07 MED ORDER — SENNOSIDES-DOCUSATE SODIUM 8.6-50 MG PO TABS
2.0000 | ORAL_TABLET | ORAL | Status: DC
  Administered 2022-08-07 – 2022-08-08 (×2): 2 via ORAL
  Filled 2022-08-07 (×2): qty 2

## 2022-08-07 MED ORDER — PRENATAL MULTIVITAMIN CH
1.0000 | ORAL_TABLET | Freq: Every day | ORAL | Status: DC
  Administered 2022-08-08: 1 via ORAL
  Filled 2022-08-07: qty 1

## 2022-08-07 MED ORDER — DIBUCAINE (PERIANAL) 1 % EX OINT
1.0000 | TOPICAL_OINTMENT | CUTANEOUS | Status: DC | PRN
  Filled 2022-08-07: qty 28

## 2022-08-07 MED ORDER — LACTATED RINGERS IV SOLN
500.0000 mL | Freq: Once | INTRAVENOUS | Status: AC
  Administered 2022-08-07: 500 mL via INTRAVENOUS

## 2022-08-07 MED ORDER — WITCH HAZEL-GLYCERIN EX PADS
1.0000 | MEDICATED_PAD | CUTANEOUS | Status: DC | PRN
  Administered 2022-08-08: 1 via TOPICAL
  Filled 2022-08-07 (×2): qty 100

## 2022-08-07 MED ORDER — OXYCODONE HCL 5 MG PO TABS: 5.0000 mg | ORAL_TABLET | ORAL | Status: DC | PRN

## 2022-08-07 MED ORDER — ONDANSETRON HCL 4 MG/2ML IJ SOLN: 4.0000 mg | INTRAMUSCULAR | Status: DC | PRN

## 2022-08-07 MED ORDER — FLEET ENEMA 7-19 GM/118ML RE ENEM: 1.0000 | ENEMA | Freq: Every day | RECTAL | Status: DC | PRN

## 2022-08-07 MED ORDER — ACETAMINOPHEN 325 MG PO TABS
650.0000 mg | ORAL_TABLET | ORAL | Status: DC | PRN
  Administered 2022-08-08 (×2): 650 mg via ORAL
  Filled 2022-08-07 (×2): qty 2

## 2022-08-07 MED ORDER — FENTANYL-BUPIVACAINE-NACL 0.5-0.125-0.9 MG/250ML-% EP SOLN
12.0000 mL/h | EPIDURAL | Status: DC | PRN
  Administered 2022-08-07: 12 mL/h via EPIDURAL

## 2022-08-07 MED ORDER — IBUPROFEN 600 MG PO TABS
600.0000 mg | ORAL_TABLET | Freq: Four times a day (QID) | ORAL | Status: DC
  Administered 2022-08-07 – 2022-08-09 (×7): 600 mg via ORAL
  Filled 2022-08-07 (×8): qty 1

## 2022-08-07 MED ORDER — ALBUTEROL SULFATE (2.5 MG/3ML) 0.083% IN NEBU: 2.5000 mg | INHALATION_SOLUTION | RESPIRATORY_TRACT | Status: DC | PRN

## 2022-08-07 NOTE — Anesthesia Preprocedure Evaluation (Signed)
Anesthesia Evaluation  Patient identified by MRN, date of birth, ID band Patient awake    Reviewed: Allergy & Precautions, H&P , NPO status , Patient's Chart, lab work & pertinent test results  History of Anesthesia Complications Negative for: history of anesthetic complications  Airway Mallampati: III   Neck ROM: full  Mouth opening: Limited Mouth Opening  Dental no notable dental hx. (+) Dental Advidsory Given   Pulmonary asthma , Current Smoker and Patient abstained from smoking.   Pulmonary exam normal        Cardiovascular Exercise Tolerance: Good Normal cardiovascular exam+ Valvular Problems/Murmurs MVP      Neuro/Psych  Headaches  Anxiety Depression       GI/Hepatic Neg liver ROS,GERD  Controlled,,  Endo/Other  negative endocrine ROS    Renal/GU negative Renal ROS  negative genitourinary   Musculoskeletal   Abdominal   Peds  Hematology negative hematology ROS (+)   Anesthesia Other Findings   Reproductive/Obstetrics (+) Pregnancy                             Anesthesia Physical Anesthesia Plan  ASA: 3  Anesthesia Plan: Epidural   Post-op Pain Management:    Induction:   PONV Risk Score and Plan:   Airway Management Planned:   Additional Equipment:   Intra-op Plan:   Post-operative Plan:   Informed Consent:      Dental Advisory Given  Plan Discussed with:   Anesthesia Plan Comments:        Anesthesia Quick Evaluation

## 2022-08-07 NOTE — Progress Notes (Signed)
Labor Check  Subj:  Complaints: }has no unusual complaints  Obj:  BP 124/79   Pulse 74   Temp 97.7 F (36.5 C) (Oral)   Resp 16   Ht 5' (1.524 m)   Wt 77.1 kg   LMP 10/26/2021 (Exact Date)   SpO2 100%   BMI 33.20 kg/m  Dose (milli-units/min) Oxytocin: 20 milli-units/min  Cervix: Dilation: 10 / Effacement (%): 60 / Station: Plus 1  Baseline WUG:QBVQXIHW: 130 bpm Contractions: regular, every 2 minutes Overall assessment: reassuring    A/P: 26 y.o. G1P0 female at [redacted]w[redacted]d with None  1.  Labor: Satisfactory labor progress. Labor Progressing normally and continue titration of low dose pitocin. Currently on 18 mil/units of Pitocin. IUPC placed, fetal wellbBeing Category I, and anticipate  NSVD. 2.  TUU:EKCMKLK assessment: Category I 3.  None 4. Membranes ruptured, clear fluid 5.  Pain: none 6.  Recheck:Evaluated by digital exam. 7. Continue present management. and Anticipate vaginal delivery.  Chari Manning CNM

## 2022-08-07 NOTE — Anesthesia Procedure Notes (Signed)
Epidural Patient location during procedure: OB Start time: 08/07/2022 11:06 AM End time: 08/07/2022 11:23 AM  Staffing Anesthesiologist: Louie Boston, MD Resident/CRNA: Ginger Carne, CRNA Performed: resident/CRNA   Preanesthetic Checklist Completed: patient identified, IV checked, site marked, risks and benefits discussed, surgical consent, monitors and equipment checked, pre-op evaluation and timeout performed  Epidural Patient position: sitting Prep: ChloraPrep Patient monitoring: heart rate, continuous pulse ox and blood pressure Approach: midline Location: L3-L4 Injection technique: LOR saline  Needle:  Needle type: Tuohy  Needle gauge: 17 G Needle length: 9 cm and 9 Needle insertion depth: 6 cm Catheter type: closed end flexible Catheter size: 19 Gauge Catheter at skin depth: 11 cm Test dose: negative and 1.5% lidocaine with Epi 1:200 K  Assessment Sensory level: T10 Events: blood not aspirated, no cerebrospinal fluid, injection not painful, no injection resistance, no paresthesia and negative IV test  Additional Notes 1 attempt Pt. Evaluated and documentation done after procedure finished. Patient identified. Risks/Benefits/Options discussed with patient including but not limited to bleeding, infection, nerve damage, paralysis, failed block, incomplete pain control, headache, blood pressure changes, nausea, vomiting, reactions to medication both or allergic, itching and postpartum back pain. Confirmed with bedside nurse the patient's most recent platelet count. Confirmed with patient that they are not currently taking any anticoagulation, have any bleeding history or any family history of bleeding disorders. Patient expressed understanding and wished to proceed. All questions were answered. Sterile technique was used throughout the entire procedure. Please see nursing notes for vital signs. Test dose was given through epidural catheter and negative prior to  continuing to dose epidural or start infusion. Warning signs of high block given to the patient including shortness of breath, tingling/numbness in hands, complete motor block, or any concerning symptoms with instructions to call for help. Patient was given instructions on fall risk and not to get out of bed. All questions and concerns addressed with instructions to call with any issues or inadequate analgesia.    Patient tolerated the insertion well without immediate complications.Reason for block:procedure for pain
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
## 2022-08-07 NOTE — Progress Notes (Signed)
Labor Check  Subj:  Complaints: }has no unusual complaints  Obj:  BP 124/79   Pulse 74   Temp 97.7 F (36.5 C) (Oral)   Resp 16   Ht 5' (1.524 m)   Wt 77.1 kg   LMP 10/26/2021 (Exact Date)   SpO2 100%   BMI 33.20 kg/m  Dose (milli-units/min) Oxytocin: 20 milli-units/min  Cervix: Dilation: 10 / Effacement (%): 60 / Station: Plus 1  Baseline YQM:GNOIBBCW: 125 bpm Contractions: regular, every 1-3 minutes Overall assessment: reassuring    A/P: 26 y.o. G1P0 female at [redacted]w[redacted]d with None  1.  Labor: Satisfactory labor progress. pain controlled  Epidural 2.  UGQ:BVQXIHW assessment: Category I 3.  None 4. Membranes ruptured, clear fluid 5.  Pain: present - adequately treated 6.  Recheck:Evaluated by digital exam. 7. Continue present management.  Chari Manning CNM

## 2022-08-07 NOTE — Discharge Summary (Signed)
See d/c summary by Donato Schultz, CNM

## 2022-08-08 LAB — COMPREHENSIVE METABOLIC PANEL
ALT: 14 U/L (ref 0–44)
AST: 27 U/L (ref 15–41)
Albumin: 2.5 g/dL — ABNORMAL LOW (ref 3.5–5.0)
Alkaline Phosphatase: 105 U/L (ref 38–126)
Anion gap: 4 — ABNORMAL LOW (ref 5–15)
BUN: 7 mg/dL (ref 6–20)
CO2: 21 mmol/L — ABNORMAL LOW (ref 22–32)
Calcium: 8.2 mg/dL — ABNORMAL LOW (ref 8.9–10.3)
Chloride: 113 mmol/L — ABNORMAL HIGH (ref 98–111)
Creatinine, Ser: 0.51 mg/dL (ref 0.44–1.00)
GFR, Estimated: >60 mL/min (ref >60)
Glucose, Bld: 101 mg/dL — ABNORMAL HIGH (ref 70–99)
Potassium: 3.7 mmol/L (ref 3.5–5.1)
Sodium: 138 mmol/L (ref 135–145)
Total Bilirubin: 0.4 mg/dL (ref 0.3–1.2)
Total Protein: 5.5 g/dL — ABNORMAL LOW (ref 6.5–8.1)

## 2022-08-08 LAB — CBC
HCT: 30.8 % — ABNORMAL LOW (ref 36.0–46.0)
Hemoglobin: 10.3 g/dL — ABNORMAL LOW (ref 12.0–15.0)
MCH: 30.3 pg (ref 26.0–34.0)
MCHC: 33.4 g/dL (ref 30.0–36.0)
MCV: 90.6 fL (ref 80.0–100.0)
Platelets: 236 10*3/uL (ref 150–400)
RBC: 3.4 MIL/uL — ABNORMAL LOW (ref 3.87–5.11)
RDW: 13.2 % (ref 11.5–15.5)
WBC: 17.2 10*3/uL — ABNORMAL HIGH (ref 4.0–10.5)
nRBC: 0 % (ref 0.0–0.2)

## 2022-08-08 MED ORDER — NIFEDIPINE ER OSMOTIC RELEASE 30 MG PO TB24
30.0000 mg | ORAL_TABLET | Freq: Every day | ORAL | Status: DC
  Administered 2022-08-08 – 2022-08-09 (×2): 30 mg via ORAL
  Filled 2022-08-08 (×2): qty 1

## 2022-08-08 MED ORDER — VARICELLA VIRUS VACCINE LIVE 1350 PFU/0.5ML IJ SUSR
0.5000 mL | INTRAMUSCULAR | Status: AC | PRN
  Administered 2022-08-08: 0.5 mL via SUBCUTANEOUS
  Filled 2022-08-08: qty 0.5

## 2022-08-08 NOTE — Lactation Note (Signed)
This note was copied from a baby's chart. Lactation Consultation Note  Patient Name: Girl Nikiya Starn ZDGLO'V Date: 08/08/2022 Reason for consult: Initial assessment;Primapara;Term Age:25 hours  Maternal Data Has patient been taught Hand Expression?: Yes Does the patient have breastfeeding experience prior to this delivery?: No  P1, Vacuum assisted vaginal delivery 17 hours ago. Mom w/ hx of anxiety and depression desires to breastfeeding with baby Mae.  Feeding Mother's Current Feeding Choice: Breast Milk and Formula  Baby has been feeding well, about every 3 hours, feeding from both breasts. Upon entry baby was already active at the breast in well positioned and aligned football hold on the L breast. (Feeding started at 0950)  LATCH Score Latch:  (already feeding upon entry)  Lactation Tools Discussed/Used    Interventions Interventions: Breast feeding basics reviewed;Hand express;Education (Newborn feeding patterns/behaviors, early cues/feeding on demand, 8-12 attempts (may not feed every time), position/alignment, determining transfer, keeping baby awake, output expectations)  Encouraged ongoing frequent attempts, provided reassurance that baby may not feed every time. Encouraged skin to skin, hand expression if baby is sleepy. Educated on normal course of lactation, milk supply and demand, baby's stomach size.  Discharge    Consult Status Consult Status: Follow-up  Whiteboard updated with LC contact number; encouraged to call today for ongoing BF support as needed.  Danford Bad 08/08/2022, 10:07 AM
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
## 2022-08-08 NOTE — Progress Notes (Signed)
Post Partum Day 1 Subjective: Doing well, no complaints.  Tolerating regular diet, pain with PO meds, voiding and ambulating without difficulty.  No CP SOB Fever,Chills, N/V or leg pain; denies nipple or breast pain  no HA change of vision, RUQ/epigastric pain  Objective: BP (!) 131/92 (BP Location: Left Arm) Comment: nurse Chelsea notified  Pulse 66   Temp 97.8 F (36.6 C) (Oral)   Resp 18   Ht 5' (1.524 m)   Wt 77.1 kg   LMP 10/26/2021 (Exact Date)   SpO2 98% Comment: Room Air  Breastfeeding Unknown   BMI 33.20 kg/m   Vitals:   08/07/22 1732 08/07/22 1747 08/07/22 1802 08/07/22 1820  BP: 124/69 (!) 121/95 132/84 122/67   08/07/22 1835 08/07/22 1850 08/07/22 1902 08/07/22 2016  BP: 119/63 (!) 141/90 125/76 134/79   08/07/22 2145 08/07/22 2317 08/08/22 0327 08/08/22 0714  BP: 137/82 130/76 128/89 (!) 131/92      Physical Exam:  General: NAD CV: RRR Pulm: nl effort, CTABL Abdomen: soft, NT, BS x 4 Perineum: minimal edema, laceration repair well approximated Lochia: scant Uterine Fundus: fundus firm and 1 fb below umbilicus DVT Evaluation: no cords, ttp LEs   Recent Labs    08/06/22 0052 08/08/22 0623  HGB 10.9* 10.3*  HCT 31.3* 30.8*  WBC 11.5* 17.2*  PLT 280 236    Assessment/Plan: 26 y.o. G1P1001 postpartum day # 1 GHTN  - Continue routine PP care - GHTN: elevated BP 130/80-90s since delivery, asymptomatic. Add on CMP pending this AM. Discussed dx with pt, start Procardia 31m XL daily now. Will need 1 wk f/u for BP check in office postpartum.  - Lactation consult prn - Acute blood loss anemia - hemodynamically stable and asymptomatic; start po ferrous sulfate BID with stool softeners  - Immunization status: Needs varicella, MMR prior to DC   Disposition: Does not desire Dc home today.     RFrancetta Found CNM 08/08/2022  8:33 AM

## 2022-08-08 NOTE — Anesthesia Postprocedure Evaluation (Signed)
Anesthesia Post Note  Patient: Jaclyn Patterson  Procedure(s) Performed: AN AD HOC LABOR EPIDURAL  Patient location during evaluation: Mother Baby Anesthesia Type: Epidural Level of consciousness: oriented and awake and alert Pain management: pain level controlled Vital Signs Assessment: post-procedure vital signs reviewed and stable Respiratory status: spontaneous breathing and respiratory function stable Cardiovascular status: blood pressure returned to baseline and stable Postop Assessment: no headache, no backache, no apparent nausea or vomiting and able to ambulate Anesthetic complications: no  No notable events documented.   Last Vitals:  Vitals:   08/07/22 2317 08/08/22 0327  BP: 130/76 128/89  Pulse: 81 79  Resp: 20 20  Temp: 36.6 C 36.6 C  SpO2: 98% 97%    Last Pain:  Vitals:   08/08/22 0327  TempSrc: Oral  PainSc:                  Starling Manns

## 2022-08-09 MED ORDER — BENZOCAINE-MENTHOL 20-0.5 % EX AERO: 1.0000 | INHALATION_SPRAY | CUTANEOUS | Status: DC | PRN

## 2022-08-09 MED ORDER — IBUPROFEN 600 MG PO TABS: 600.0000 mg | ORAL_TABLET | Freq: Four times a day (QID) | ORAL | 0 refills | Status: DC

## 2022-08-09 MED ORDER — ACETAMINOPHEN 325 MG PO TABS: 650.0000 mg | ORAL_TABLET | ORAL | Status: DC | PRN

## 2022-08-09 MED ORDER — SENNOSIDES-DOCUSATE SODIUM 8.6-50 MG PO TABS: 2.0000 | ORAL_TABLET | Freq: Every evening | ORAL | Status: DC | PRN

## 2022-08-09 MED ORDER — NIFEDIPINE ER 30 MG PO TB24: 30.0000 mg | ORAL_TABLET | Freq: Every day | ORAL | 3 refills | Status: DC

## 2022-08-09 MED ORDER — WITCH HAZEL-GLYCERIN EX PADS: 1.0000 | MEDICATED_PAD | CUTANEOUS | 12 refills | Status: DC | PRN

## 2022-08-09 MED ORDER — SERTRALINE HCL 100 MG PO TABS: 100.0000 mg | ORAL_TABLET | Freq: Every day | ORAL | Status: DC

## 2022-08-09 NOTE — Lactation Note (Signed)
This note was copied from a baby's chart. Lactation Consultation Note  Patient Name: Jaclyn Patterson [id number] Date: 08/09/2022 Reason for consult: Follow-up assessment;Primapara;Term;Breastfeeding assistance;RN request Age:25 hours  Maternal Data This is mom's first baby, vaginal/vacuum assisted delivery. Mom with history of gestational HTN, anxiety, and depression.   Today on follow-up mom reports sore nipples and a small scab that has formed on the tip of both nipples.  Has patient been taught Hand Expression?: Yes Does the patient have breastfeeding experience prior to this delivery?: No  Feeding Mother's Current Feeding Choice: Breast Milk Provided mom with tips and strategies to maximize position and latch technique. Mom was able to flange baby's lips outward on to areola . Baby with multiple swallows. Per mom she "just wants to make sure she is doing everything right." Reassurance and encouragement provided. Reviewed what to expect in the first days when breastfeeding including how to know the baby is getting enough. LATCH Score Latch: Grasps breast easily, tongue down, lips flanged, rhythmical sucking. (Baby with lips rolled inward.Instructed mom on how to flange lips outward.)  Audible Swallowing: Spontaneous and intermittent  Type of Nipple: Everted at rest and after stimulation  Comfort (Breast/Nipple): Filling, red/small blisters or bruises, mild/mod discomfort  Hold (Positioning): Assistance needed to correctly position infant at breast and maintain latch.  LATCH Score: 8   Lactation Tools Discussed/Used Tools: Pump Breast pump type: Other (comment) (Personal DEBP from home that mom wanted instruction on.) Pump Education: Setup, frequency, and cleaning;Milk Storage Reason for Pumping: Review use of mom's personal pump to use as needed.  Interventions Interventions: Breast feeding basics reviewed;Assisted with latch;Breast massage;Hand express;Breast  compression;Adjust position;Support pillows;Education (lanolin) Reviewed sore nipple management.  Discharge Discharge Education: Engorgement and breast care;Warning signs for feeding baby;Outpatient recommendation Pump: Personal  Consult Status Consult Status: Complete Date: 08/09/22 Follow-up type: In-patient  Update provided to care nurse.  Fuller Song 08/09/2022, 11:28 AM

## 2022-08-09 NOTE — Progress Notes (Signed)
Patient discharged. Discharge instructions given. Patient verbalizes understanding. Transported by axillary. 

## 2022-08-19 ENCOUNTER — Telehealth: Payer: Self-pay | Admitting: Licensed Clinical Social Worker

## 2022-08-19 NOTE — Telephone Encounter (Signed)
-----   Message from Gustavo Lah, CNM sent at 08/15/2022  5:38 PM EST ----- Regarding: RE: Postpartum depression  Marchelle Folks,  I discussed asking you and then one of [place] would reach out her depending on what you recommend. I appreciate your help!  Thank you,  Tobi Bastos ----- Message ----- From: Kathreen Cosier, [place] Sent: 08/15/2022   2:07 PM EST To: Gustavo Lah, CNM Subject: RE: Postpartum depression                      Hello Tobi Bastos,  Sure, I will be happy to reach out to her, is she aware that I may be reaching out?   Kind regards,  Marchelle Folks  ----- Message ----- From: Gustavo Lah, CNM Sent: 08/15/2022   1:40 PM EST To: Kathreen Cosier, LCSW Subject: Postpartum depression                          Marchelle Folks,  I recently saw Toni Amend for a mood check. She has depression and anxiety. Currently takes Zoloft. She's had increased symptoms postpartum. 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 I reviewed coping skills with her for that and increased her Zoloft to 150mg . I think she would benefit from counseling too. Would you be able to see her for counseling?  Thank you,  , CNM

## 2022-10-03 ENCOUNTER — Other Ambulatory Visit: Payer: Self-pay | Admitting: Obstetrics and Gynecology

## 2022-10-03 DIAGNOSIS — N939 Abnormal uterine and vaginal bleeding, unspecified: Secondary | ICD-10-CM

## 2022-10-04 ENCOUNTER — Ambulatory Visit
Admission: RE | Admit: 2022-10-04 | Discharge: 2022-10-04 | Disposition: A | Discharge status: Home / Self Care | Payer: Medicaid Other | Source: Ambulatory Visit | Attending: Obstetrics and Gynecology | Admitting: Obstetrics and Gynecology

## 2022-10-04 DIAGNOSIS — N939 Abnormal uterine and vaginal bleeding, unspecified: Secondary | ICD-10-CM

## 2022-11-28 IMAGING — CR DG HAND COMPLETE 3+V*L*
3 series · 3 of 3 positions shown · non-contrast
Comparison: None.

CLINICAL DATA: Recent dog bite, initial encounter

EXAM:
LEFT HAND - COMPLETE 3+ VIEW

[hand ap]
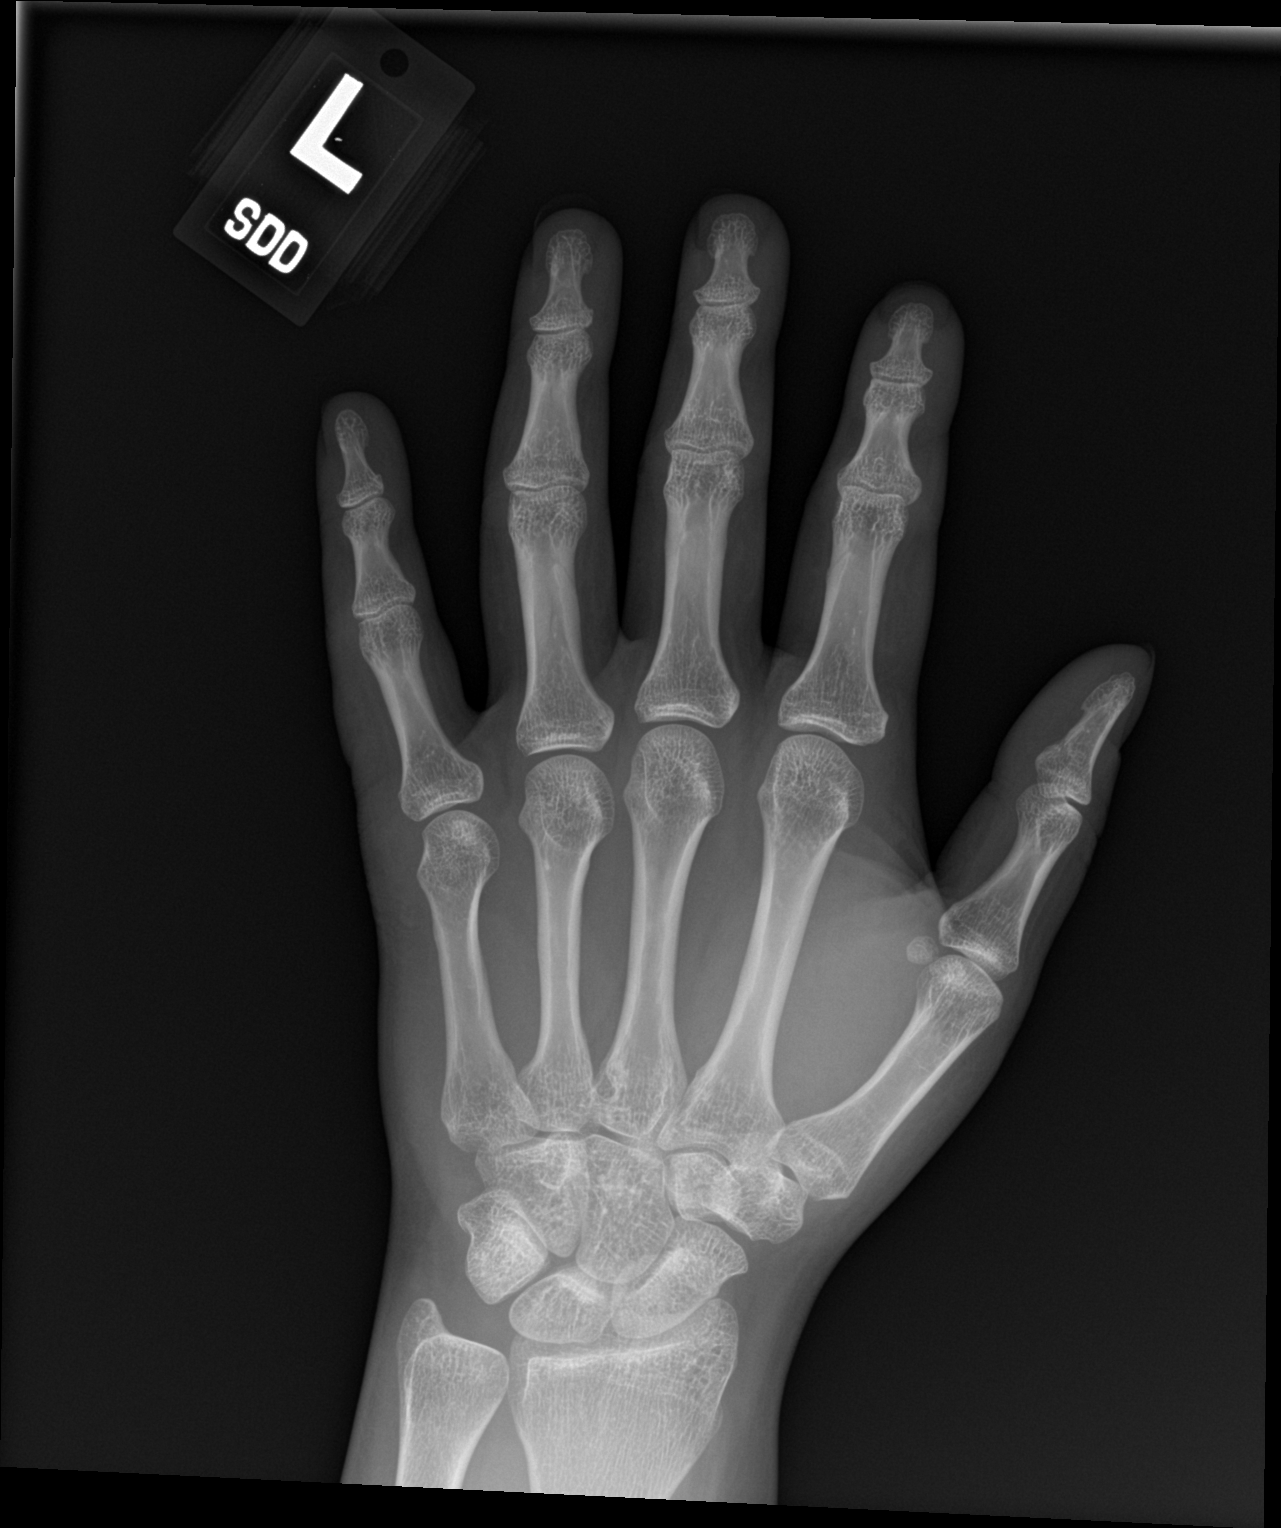

[hand obl]
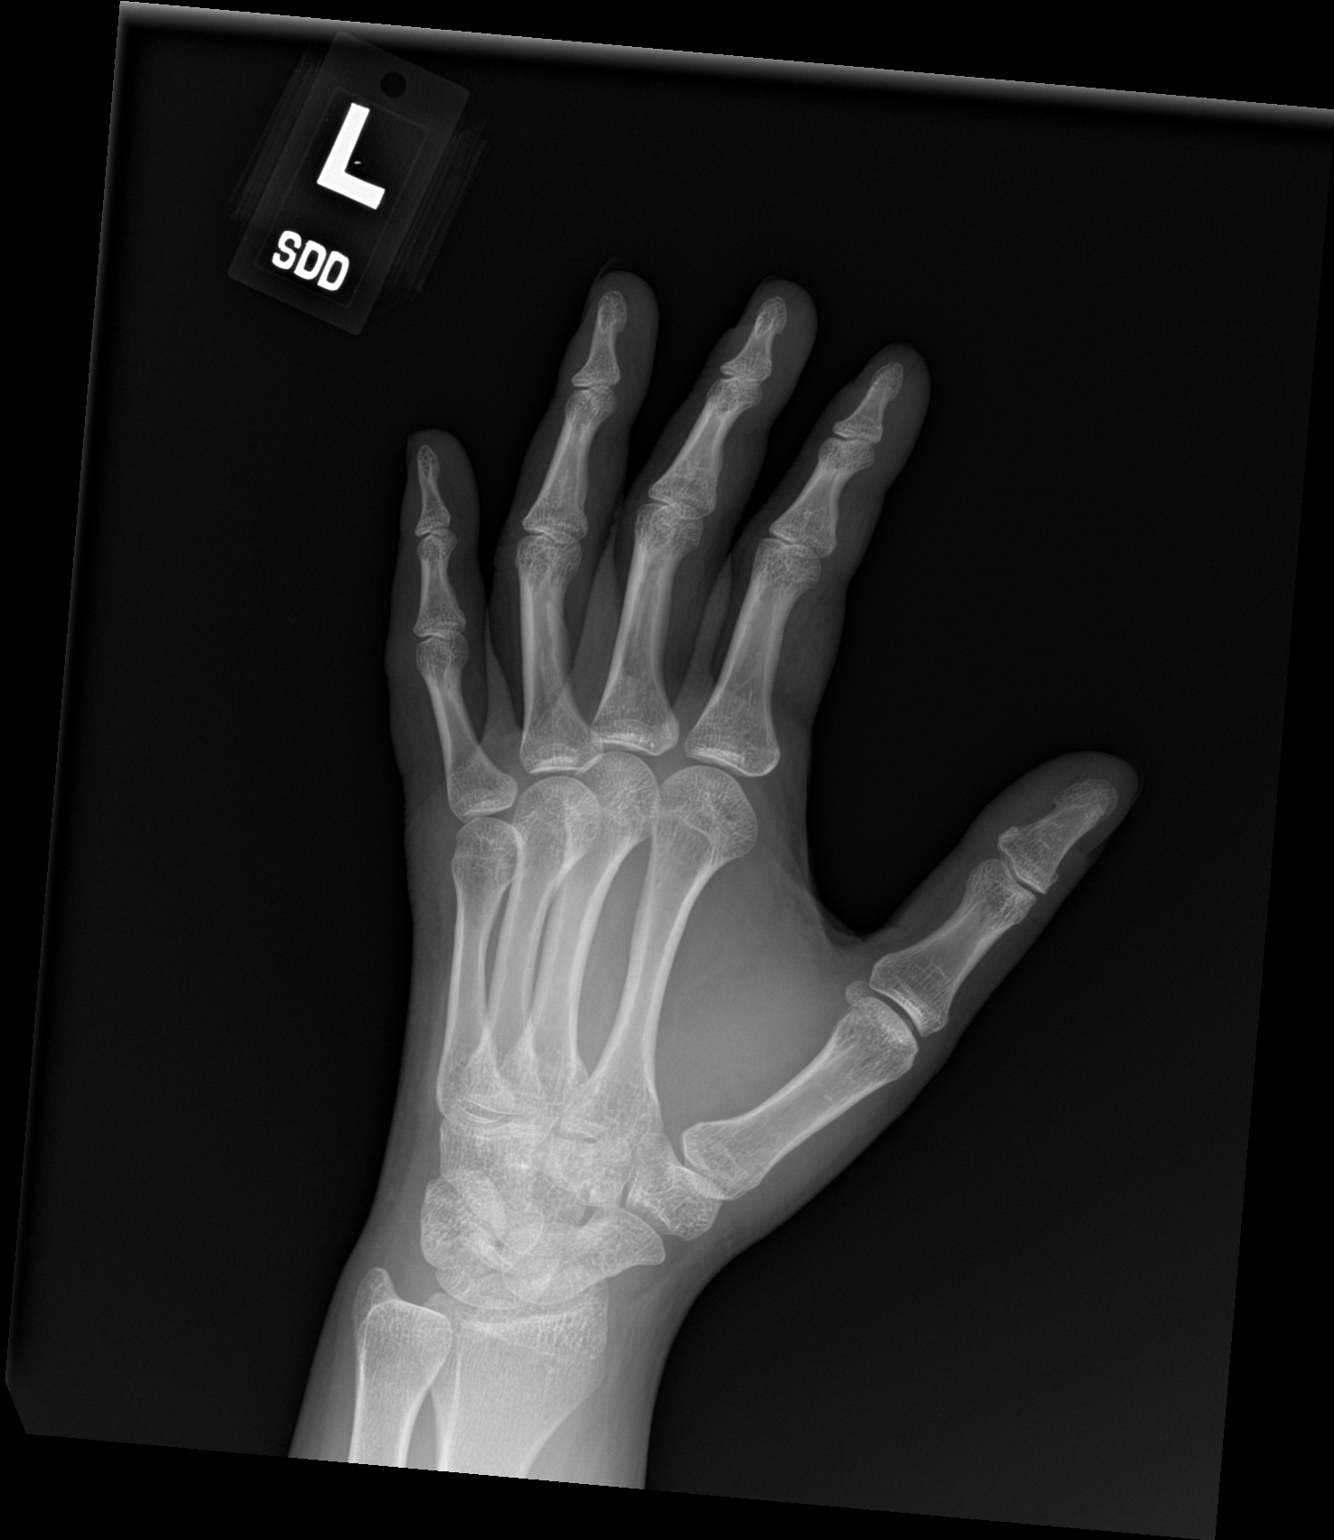

[hand lat]
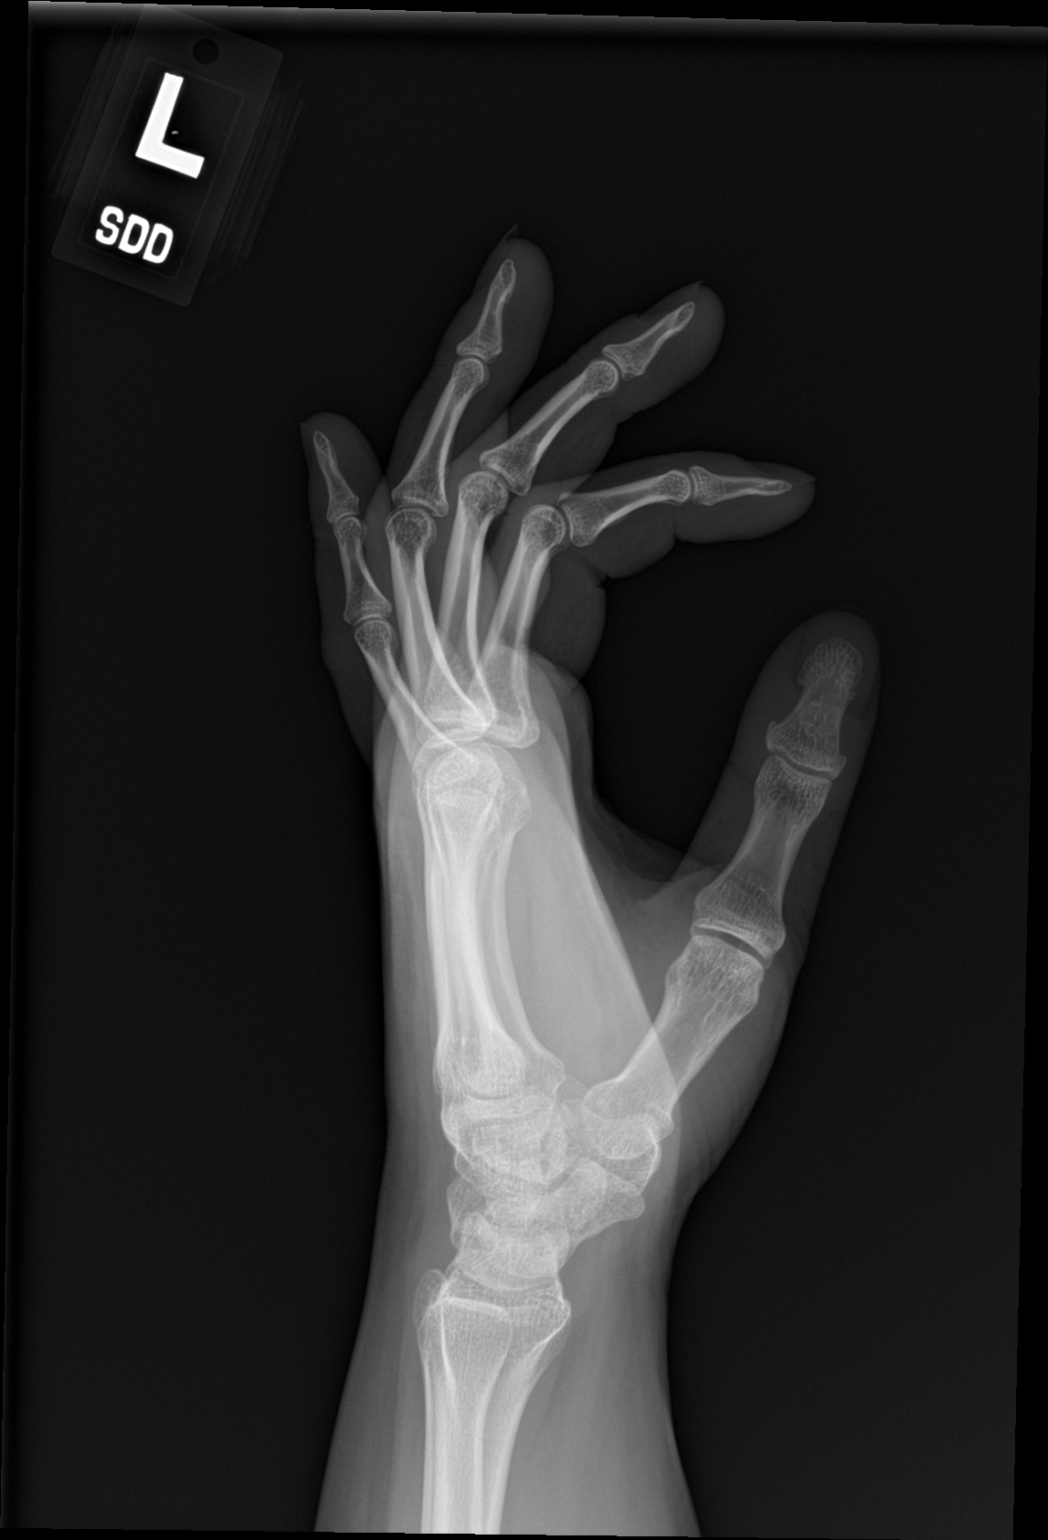

[3 of 3 positions shown; findings below may reference images not displayed]

FINDINGS: No acute fracture or dislocation is noted. No soft tissue
abnormality is seen.
IMPRESSION: No acute abnormality noted.

## 2023-10-20 ENCOUNTER — Other Ambulatory Visit: Payer: Self-pay

## 2023-10-20 ENCOUNTER — Emergency Department: Payer: Medicaid Other

## 2023-10-20 ENCOUNTER — Emergency Department
Admission: EM | Admit: 2023-10-20 | Discharge: 2023-10-21 | Disposition: A | Discharge status: Home / Self Care | Payer: Medicaid Other | Attending: Emergency Medicine | Admitting: Emergency Medicine

## 2023-10-20 DIAGNOSIS — M62838 Other muscle spasm: Secondary | ICD-10-CM | POA: Insufficient documentation

## 2023-10-20 DIAGNOSIS — M25512 Pain in left shoulder: Secondary | ICD-10-CM | POA: Diagnosis present

## 2023-10-20 NOTE — ED Triage Notes (Signed)
 Pt reports pain to left shoulder intermittently for the past 6 months. Pt reports pain normally last a week and then resolves on its own. Pt denies injury to area.

## 2023-10-21 MED ORDER — LIDOCAINE 5 % EX PTCH: 1.0000 | MEDICATED_PATCH | CUTANEOUS | 0 refills | Status: AC

## 2023-10-21 MED ORDER — METHOCARBAMOL 500 MG PO TABS: 1000.0000 mg | ORAL_TABLET | Freq: Three times a day (TID) | ORAL | 0 refills | Status: DC | PRN

## 2023-10-21 MED ORDER — METHOCARBAMOL 500 MG PO TABS
1000.0000 mg | ORAL_TABLET | Freq: Once | ORAL | Status: AC
  Administered 2023-10-21: 1000 mg via ORAL
  Filled 2023-10-21: qty 2

## 2023-10-21 MED ORDER — LIDOCAINE 5 % EX PTCH
1.0000 | MEDICATED_PATCH | Freq: Once | CUTANEOUS | Status: DC
  Administered 2023-10-21: 1 via TRANSDERMAL
  Filled 2023-10-21: qty 1

## 2023-10-21 NOTE — ED Provider Notes (Signed)
[facility] Provider Note    Event Date/Time   First MD Initiated Contact with Patient 10/21/23 0002     (approximate)   History   Shoulder Pain   HPI Neylan Schemm is a 28 y.o. female presenting today for left shoulder pain.  Patient states symptoms started approximately 6 months ago and have been intermittent since then.  She came today because pain has been ongoing over the past 24 hours without significant relief.  She has tried ibuprofen with minimal relief.  Pain originates behind her left shoulder and exacerbated when she picks up her daughter.  Denies any trauma to the arm.  No neck pain or neck trauma.  Slight tingling of her fingertips 1 time but not currently present.     Physical Exam   Triage Vital Signs: ED Triage Vitals  Encounter Vitals Group     BP 10/20/23 1939 134/89     Systolic BP Percentile --      Diastolic BP Percentile --      Pulse Rate 10/20/23 1939 79     Resp 10/20/23 1939 18     Temp 10/20/23 1939 98.6 F (37 C)     Temp Source 10/20/23 1939 Oral     SpO2 10/20/23 1939 100 %     Weight 10/20/23 1938 155 lb (70.3 kg)     Height 10/20/23 1938 5\' 2"  (1.575 m)     Head Circumference --      Peak Flow --      Pain Score 10/20/23 1938 6     Pain Loc --      Pain Education --      Exclude from Growth Chart --     Most recent vital signs: Vitals:   10/20/23 1939  BP: 134/89  Pulse: 79  Resp: 18  Temp: 98.6 F (37 C)  SpO2: 100%   I have reviewed the vital signs. General:  Awake, alert, no acute distress. Head:  Normocephalic, Atraumatic. EENT:  PERRL, EOMI, Oral mucosa pink and moist, Neck is supple. Cardiovascular: Regular rate, 2+ distal pulses. Respiratory:  Normal respiratory effort, symmetrical expansion, no distress.   Extremities: No obvious deformity throughout left shoulder left upper extremity.  Slight tenderness palpation over the left trapezius.  Pain symptoms exacerbated with range of motion of the  left shoulder especially abduction Neuro:  Alert and oriented.  Interacting appropriately.  Sensation intact throughout left upper extremity Skin:  Warm, dry, no rash.   Psych: Appropriate affect.     ED Results / Procedures / Treatments   Labs (all labs ordered are listed, but only abnormal results are displayed) Labs Reviewed - No data to display   EKG    RADIOLOGY Independently interpreted x-ray with no acute pathology   PROCEDURES:  Critical Care performed: No  Procedures   MEDICATIONS ORDERED IN ED: Medications  lidocaine (LIDODERM) 5 % 1 patch (has no administration in time range)  methocarbamol (ROBAXIN) tablet 1,000 mg (has no administration in time range)     IMPRESSION / MDM / ASSESSMENT AND PLAN / ED COURSE  I reviewed the triage vital signs and the nursing notes.                              Differential diagnosis includes, but is not limited to, muscle spasm, degenerative disease of the left shoulder  Patient's presentation is most consistent with acute complicated illness / injury  requiring diagnostic workup.  Patient is a 28 year old female presenting today for pain to the left shoulder.  Exam seems most consistent with spasm of the trapezius muscle likely exacerbated by constantly carrying around her 25-year-old child as symptoms developed shortly after that childbirth.  Neurovascularly intact otherwise.  No acute findings.  X-ray negative.  Will treat with Lidoderm patch and Robaxin and have her follow-up with orthopedics as needed.  No tenderness palpation to C-spine or other neuropathic pain coming from that area.     FINAL CLINICAL IMPRESSION(S) / ED DIAGNOSES   Final diagnoses:  Trapezius muscle spasm     Rx / DC Orders   ED Discharge Orders          Ordered    AMB referral to orthopedics        10/21/23 0043    lidocaine (LIDODERM) 5 %  Every 24 hours        10/21/23 0043    methocarbamol (ROBAXIN) 500 MG tablet  Every 8 hours PRN         10/21/23 0043             Note:  This document was prepared using Dragon voice recognition software and may include unintentional dictation errors.   Janith Lima, MD 10/21/23 [phone]

## 2023-10-21 NOTE — Discharge Instructions (Signed)
Please pick up the medications from the pharmacy and take as prescribed or as needed.  Please follow-up with orthopedics for ongoing evaluation outpatient.  They should give you a call for follow-up.

## 2024-03-18 DIAGNOSIS — Z3480 Encounter for supervision of other normal pregnancy, unspecified trimester: Secondary | ICD-10-CM | POA: Insufficient documentation

## 2024-03-19 LAB — OB RESULTS CONSOLE RUBELLA ANTIBODY, IGM: Rubella: IMMUNE

## 2024-03-19 LAB — OB RESULTS CONSOLE RPR: RPR: NONREACTIVE

## 2024-03-19 LAB — OB RESULTS CONSOLE VARICELLA ZOSTER ANTIBODY, IGG: Varicella: NON-IMMUNE/NOT IMMUNE

## 2024-03-19 LAB — OB RESULTS CONSOLE HIV ANTIBODY (ROUTINE TESTING): HIV: NONREACTIVE

## 2024-03-19 LAB — OB RESULTS CONSOLE HEPATITIS B SURFACE ANTIGEN: Hepatitis B Surface Ag: NEGATIVE

## 2024-04-03 ENCOUNTER — Other Ambulatory Visit: Payer: Self-pay

## 2024-04-03 ENCOUNTER — Encounter: Payer: Self-pay | Admitting: Emergency Medicine

## 2024-04-03 DIAGNOSIS — O26892 Other specified pregnancy related conditions, second trimester: Secondary | ICD-10-CM | POA: Diagnosis present

## 2024-04-03 DIAGNOSIS — S301XXA Contusion of abdominal wall, initial encounter: Secondary | ICD-10-CM | POA: Diagnosis not present

## 2024-04-03 DIAGNOSIS — O9A212 Injury, poisoning and certain other consequences of external causes complicating pregnancy, second trimester: Secondary | ICD-10-CM | POA: Insufficient documentation

## 2024-04-03 DIAGNOSIS — W228XXA Striking against or struck by other objects, initial encounter: Secondary | ICD-10-CM | POA: Insufficient documentation

## 2024-04-03 DIAGNOSIS — Z3A14 14 weeks gestation of pregnancy: Secondary | ICD-10-CM | POA: Insufficient documentation

## 2024-04-03 NOTE — ED Triage Notes (Signed)
 To ER from home after child's head struck patient's pregnant abdomen. Since then reporting pain to abdomen, anxious, and tearful. Reports aching at rest.  [redacted] weeks pregnant.

## 2024-04-04 ENCOUNTER — Emergency Department
Admission: EM | Admit: 2024-04-04 | Discharge: 2024-04-04 | Disposition: A | Discharge status: Home / Self Care | Attending: Emergency Medicine | Admitting: Emergency Medicine

## 2024-04-04 ENCOUNTER — Emergency Department

## 2024-04-04 DIAGNOSIS — S301XXA Contusion of abdominal wall, initial encounter: Secondary | ICD-10-CM

## 2024-04-04 LAB — URINALYSIS, ROUTINE W REFLEX MICROSCOPIC
Bilirubin Urine: NEGATIVE
Glucose, UA: NEGATIVE mg/dL
Hgb urine dipstick: NEGATIVE
Ketones, ur: NEGATIVE mg/dL
Leukocytes,Ua: NEGATIVE
Nitrite: NEGATIVE
Protein, ur: NEGATIVE mg/dL
Specific Gravity, Urine: 1.013 (ref 1.005–1.030)
pH: 7 (ref 5.0–8.0)

## 2024-04-04 NOTE — ED Provider Notes (Signed)
 [facility] Provider Note    Event Date/Time   First MD Initiated Contact with Patient 04/04/24 [phone]     (approximate)   History   Abdominal Pain ([redacted] weeks pregnant)   HPI  Madhavi Pedley is a 28 y.o. female [id number] with LMP 4/19 who presents with right lower abdominal pain after she was accidentally struck by her 30-month-old daughter.  The patient states for that her daughter stretched while in bed and hit her head on the patient's abdomen.  She reports localized pain to that area.  She denies any associated vaginal bleeding.  She has no nausea or vomiting.  I reviewed the past medical records.  The patient's most recent outpatient care was with OB/GYN for initiation of prenatal care on 7/11.   Physical Exam   Triage Vital Signs: ED Triage Vitals  Encounter Vitals Group     BP 04/03/24 2336 121/80     Girls Systolic BP Percentile --      Girls Diastolic BP Percentile --      Boys Systolic BP Percentile --      Boys Diastolic BP Percentile --      Pulse Rate 04/03/24 2336 88     Resp 04/03/24 2336 18     Temp 04/03/24 2336 98.2 F (36.8 C)     Temp Source 04/03/24 2336 Oral     SpO2 04/03/24 2336 98 %     Weight 04/03/24 2334 158 lb (71.7 kg)     Height 04/03/24 2334 5' 2 (1.575 m)     Head Circumference --      Peak Flow --      Pain Score 04/03/24 2336 3     Pain Loc --      Pain Education --      Exclude from Growth Chart --     Most recent vital signs: Vitals:   04/03/24 2336 04/04/24 0136  BP: 121/80   Pulse: 88   Resp: 18   Temp: 98.2 F (36.8 C)   SpO2: 98% 100%     General: Awake, no distress.  CV:  Good peripheral perfusion.  Resp:  Normal effort.  Abd:  Soft with focal right lower abdominal wall tenderness.  No distention.  Other:  No jaundice or scleral icterus.   ED Results / Procedures / Treatments   Labs (all labs ordered are listed, but only abnormal results are displayed) Labs Reviewed  URINALYSIS,  ROUTINE W REFLEX MICROSCOPIC - Abnormal; Notable for the following components:      Result Value   Color, Urine STRAW (*)    APPearance CLEAR (*)    All other components within normal limits     EKG     RADIOLOGY  US  OB Limited: I independently viewed and interpreted the images; there is an IUP with FHR present.  The radiology report indicates the following:  IMPRESSION:  Single live intrauterine gestation at 14 weeks. No acute abnormality  is noted.    This exam is performed on an emergent basis and does not  comprehensively evaluate fetal size, dating, or anatomy; follow-up  complete OB US  should be considered if further fetal assessment is  warranted.    PROCEDURES:  Critical Care performed: No  Procedures   MEDICATIONS ORDERED IN ED: Medications - No data to display   IMPRESSION / MDM / ASSESSMENT AND PLAN / ED COURSE  I reviewed the triage vital signs and the nursing notes.  28 year old female with PMH  as noted above currently [redacted] weeks pregnant, presents with minor abdominal trauma.  On exam she has mild localized tenderness.  Differential diagnosis includes, but is not limited to, abdominal contusion.  Urinalysis is negative.  Ultrasound shows a live IUP with no evidence of acute abnormality.  Patient's presentation is most consistent with acute complicated illness / injury requiring diagnostic workup.  At this time, there is no indication for further ED workup or treatment.  I counseled her on the results of the ultrasound and she is reassured.  She is stable for discharge home at this time.  Return precautions provided, and she expressed understanding.   FINAL CLINICAL IMPRESSION(S) / ED DIAGNOSES   Final diagnoses:  Contusion of abdominal wall, initial encounter     Rx / DC Orders   ED Discharge Orders     None        Note:  This document was prepared using Dragon voice recognition software and may include unintentional dictation errors.     Jacolyn Pae, MD 04/04/24 [phone]

## 2024-04-04 NOTE — Discharge Instructions (Signed)
 You likely have a mild contusion of the wall of your abdomen.  There is no sign of any complication with the pregnancy.  Follow-up with your OB/GYN as scheduled.  Return to the ER for new, worsening, or persistent severe abdominal pain, vaginal bleeding, or any other new or worsening symptoms that concern you.

## 2024-04-16 NOTE — Progress Notes (Signed)
 ROUTINE OB VISIT  Ms. Piggott 28 y.o. G3P1011 at [redacted]w[redacted]d  Factors complicating this pregnancy: -Obesity BMI:  Baseline labs: Early 1-hr GTT: 109             P/C ratio:108   CMP:wnl          A1c: 5.2 History of anxiety No current or recent medications  Mild controlled asthma PRN inhaler - uses 1x every 2-3 months History of Grade 1 Cardiac Murmur w/ palpitations Reports of increase in palpitations with G1 At baseline, 1-2 episodes of palpitations a month w/ SOB Followed with [facility]ology 11/2022 11/2022 - Normal EKG. Echo - normal left ventricular function w/ EF 50% with trivial valvular insufficiencies  Recommended 400 mg magnesium glycinate daily & limit caffeine intake  S: No LOF, VB. Denies dysuria, abnormal discharge. Low back pain O: Ht 152.4 cm (5')   LMP 12/27/2023   BMI 30.62 kg/m   Abdomen: Gravid, nontender;  Ext : no edema, no rashes SVE deferred  Fetal heart tones: 140 distinguished from mom  A/P: IUP at [redacted]w[redacted]d Recommend PT  Precautions and warning s/s reviewed  Diagnoses and all orders for this visit:  Supervision of other normal pregnancy, antepartum (HHS-HCC) -     US  FDC OB greater than 14 weeks single gest routine anatomy; Future    RTC in 4 weeks  BETHANY JOHNATHAN PENTON, MD

## 2024-05-12 DIAGNOSIS — O0992 Supervision of high risk pregnancy, unspecified, second trimester: Secondary | ICD-10-CM | POA: Insufficient documentation

## 2024-05-12 NOTE — Progress Notes (Signed)
 Obstetrics & Gynecology Office Visit  Subjective  Jaclyn Patterson is a 28 y.o. G3P1011 at [redacted]w[redacted]d being seen today for ongoing prenatal care.  She is currently monitored for tthis high-risk pregnancy.  Patient's last menstrual period was 12/27/2023. Estimated Date of Delivery: 10/02/24  History of Present Illness   Patient concerns today: having lower back pain  Pt denies contractions, vaginal bleeding, leaking fluid. Endorses good fetal movementoccasional Pt denies HA, VD or RUQ pain.   Objective  BP 111/75   Pulse 96   Ht 152.4 cm (5')   Wt 73.3 kg (161 lb 9.6 oz)   LMP 12/27/2023   BMI 31.56 kg/m    Fetal Status:          Pre-pregnant weight: 71.1 kg (156 lb 12.8 oz)  TWG: 2.177 kg (4 lb 12.8 oz)  Gen: NAD  Pulm: No use of accessory muscles, normal respirations Abdomen: Gravid, nontender Ext : no edema, no rashes.   Psych: Mood, insight, judgement intact SVE: deferred Imaging Results ULTRASOUND REPORT Second/third trimester findings: AFI: normal and Placenta localization: anterior Cervical length: 3.8 Fetal presentation: variable Picture taken US  EDD 10/03/23 Impression: Intrauterine pregnancy with an estimated gestational of [redacted]w[redacted]d. Ultrasound performed today with asynchronous interpretation of routine anatomy views at the [facility]. Ultrasound cannot rule out all aneuploidy or exclude all structural abnormalities. Fetal anatomy survey limited due to fetal lie. 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 Referral to MFM Assessment   28 y.o. G3P1011 at [redacted]w[redacted]d by  10/02/2024, by Last Menstrual Period presenting for routine prenatal visit  Plan   Problem list reviewed and/or updated   Assessment & Plan     Orders Placed This Encounter  Procedures  . Ambulatory Referral to Maternal-Fetal Medicine    Referral Priority:   Emergency    Referral Type:   Consultation    Requested Specialty:   Maternal and Fetal Medicine    Number of Visits Requested:   1    1.  Supervision of high risk pregnancy in second trimester (HHS-HCC) -abnormal anatomy scan done today  2. 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 History of anxiety -start Zoloft  50 mg today  4. Obesity in pregnancy, antepartum (HHS-HCC) -2.177 kg (4 lb 12.8 oz)   5. Asthma affecting pregnancy, antepartum (HHS-HCC) PRN inhaler - uses 1x every 2-3 months   6. H/O cardiac murmur Reports of increase in palpitations with G1 At baseline, 1-2 episodes of palpitations a month w/ SOB Followed with [facility] cardiology 11/2022 11/2022 - Normal EKG. Echo - normal left ventricular function w/ EF 50% with trivial valvular insufficiencies  Recommended 400 mg magnesium glycinate daily & limit caffeine intake    Call with bleeding, contractions, PROM, or decreased fetal movement.   Return in about 4 weeks (around 06/09/2024) for Routine PNC.   Attestation Statement:   I personally performed the service, non-incident to. (WP)   FELICIA L DICKERSON, CNM

## 2024-05-13 NOTE — Progress Notes (Signed)
 Referring Provider:  [facility] Length of Consultation:60 minutes   Jaclyn Patterson was referred for genetic counseling following a level 2 ultrasound at the [facility].  This letter is a summary of our discussion.  At the time of this visit, a detailed ultrasound was performed.  The gestational age was confirmed to be 19 weeks 4 days.  An echogenic bowel and single umbilical artery were noted on ultrasound.  The remainder of the fetal anatomy was seen, and was within normal limits.  An echogenic bowel refers to an area in the fetal intestines that appears brighter, or denser, than the liver and/or bone on ultrasound. Echogenic bowel is detected in approximately 1% of fetuses at the time of a second trimester ultrasound evaluation. Most of the time, this brightness does not cause any problems and will spontaneously resolve. Possible causes of an echogenic bowel include undergrowth of the bowel, an obstruction or blockage of the intestines, the fetus swallowing a small amount of blood present in the amniotic fluid, an intrauterine infection, a chromosome condition, cystic fibrosis, or a normal variation in development. Its presence also increases the risk of adverse pregnancy outcome such as growth restriction or miscarriage. The greatest chance is that the echogenic bowel identified in this pregnancy is a normal variation without any adverse effects. As we discussed, the umbilical cord typically contains two arteries, which return waste materials and oxygen-poor blood to the mother from the baby. There is also one vein which brings oxygen rich blood to the baby.  In at least 1% of pregnancies, there is only one umbilical artery present.  The majority of these newborns is normal and healthy, and has no other complications.  When a single umbilical artery is seen on ultrasound, the chance for other associated birth differences is known to be increased. Approximately 20% of infants with a  single umbilical artery have various physical or structural abnormalities, although this number varies in the literature. The remainder has no apparent abnormalities.  In the absence of other birth differences, we know that the chance for low birth weight, fetal growth restriction, and premature delivery may still be a concern.     If no other abnormalities are seen, the prognosis with a single umbilical artery is usually good.  We discussed that the rest of the ultrasound was within normal limits and no other physical abnormalities were noted.  We discussed that a follow-up ultrasound in the third trimester should be performed to check the growth of the baby.     We discussed that there is a slight association between single umbilical arteries and chromosome abnormalities. Presence of a single umbilical artery somewhat increases the risk for a fetal chromosome condition (compared to baseline maternal age or screen related risk for a chromosome condition before the single umbilical artery was found).  The increase in risk for a chromosome problem is more significant if there are other ultrasound markers or abnormalities, and less significant if single umbilical artery is the only atypical ultrasound finding.  This risk is thought to be low if there has been risk reducing NIPT results. Jaclyn Patterson has had low risk MaterniT21 screening in this pregnancy.  She has had negative carrier screening for cystic fibrosis via the Inheritest CF/SMA panel that was performed at her OB office. Chromosomes are the inherited structures that contain our genes (traits).  Each cell of our body normally has 46 chromosomes, matched up in 23 pairs.  The last pair determines our  biological sex and are called the sex chromosomes.  A female has an X and a Y chromosome, while a female has two X chromosomes.  Rarely, when a mother's egg or father's sperm unite, an extra or missing chromosome can be passed on to the baby by mistake.  We  discussed examples of such a problem including: Down syndrome (an extra 1) and Edward syndrome (an extra 18), both involving some degree of intellectual disability and physical problems. We also discussed that because of the possibility of other abnormalities, it is sometimes recommended that newborns with a single umbilical artery be thoroughly evaluated.     Given the presence of two ultrasound finding, we reviewed the option of amniocentesis.  We discussed the following prenatal testing options for this pregnancy:  Amniocentesis involves the removal of a small amount of amniotic fluid from the sac surrounding the fetus with the use of a thin needle inserted through the mother's abdomen and uterus.  Ultrasound guidance is used throughout the procedure.  Fetal cells are directly evaluated and >99.5% of chromosome problems and >98% of neural tube defects can be detected.   Chromosome microarray analysis is a more sensitive type of chromosome analysis that is able to look for small gains or deletions of chromosomal material that are too small to be seen by standard karyotype analysis.  If a deletion or gain of chromosome material is observed, it is difficult to determine the clinical significance of the change, but parental studies may be appropriate follow-up if an abnormality is noted.  If parental analysis is normal, any gain or deletion of chromosome material is likely to be responsible for the abnormal features noted in the baby.  In general, gains or losses of sub-microscopic chromosomal material in a baby carry a low recurrence risk, and are unlikely to occur in subsequent pregnancies.     This analysis also includes smaller regions surrounding areas of the genome which are known to be associated with microdeletion/duplication syndromes, and can assess for evidence of uniparental disomy or consanguinity.  Testing for cytomegalovirus and toxoplasmosis was offered in light of the echogenic  bowel.  We reviewed the option for prenatal testing for cystic fibrosis (97 mutation) since her partner has not been tested.  We reviewed autosomal recessive inheritance and the low risk for fetal CF given her own negative carrier screening results.    The main risks to this procedure are complications leading to miscarriage in less than 1 in 200 cases (0.5%).  Lastly, we mentioned that each pregnancy in the general population has a 2-3% chance for a birth defect that might not be detected prenatally.  A detailed family history was not obtained, but no birth defects, intellectual disability recurrent pregnancy loss or known chromosome abnormalities were reported.  After consideration of the options, the patient elected to proceed with amniocentesis.   The patient was encouraged to call with questions or concerns.  We can be contacted at [phone].  TESTS ORDERED: AF-AFP 5 cell chromosome analysis with array Fetal CF analysis, 97 mutation Toxo PCR CMV PCR Maternal cell contamination studies
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
## 2024-05-27 ENCOUNTER — Encounter: Payer: Self-pay | Admitting: Obstetrics and Gynecology

## 2024-05-27 ENCOUNTER — Observation Stay
Admission: EM | Admit: 2024-05-27 | Discharge: 2024-05-27 | Disposition: A | Discharge status: Home / Self Care | Source: Ambulatory Visit

## 2024-05-27 ENCOUNTER — Observation Stay

## 2024-05-27 ENCOUNTER — Other Ambulatory Visit: Payer: Self-pay

## 2024-05-27 DIAGNOSIS — O26899 Other specified pregnancy related conditions, unspecified trimester: Secondary | ICD-10-CM | POA: Diagnosis present

## 2024-05-27 DIAGNOSIS — M5432 Sciatica, left side: Secondary | ICD-10-CM | POA: Diagnosis not present

## 2024-05-27 DIAGNOSIS — O26892 Other specified pregnancy related conditions, second trimester: Principal | ICD-10-CM | POA: Insufficient documentation

## 2024-05-27 DIAGNOSIS — Z3A21 21 weeks gestation of pregnancy: Secondary | ICD-10-CM | POA: Diagnosis not present

## 2024-05-27 DIAGNOSIS — O99352 Diseases of the nervous system complicating pregnancy, second trimester: Secondary | ICD-10-CM | POA: Diagnosis not present

## 2024-05-27 DIAGNOSIS — R102 Pelvic and perineal pain: Principal | ICD-10-CM | POA: Insufficient documentation

## 2024-05-27 DIAGNOSIS — O283 Abnormal ultrasonic finding on antenatal screening of mother: Secondary | ICD-10-CM | POA: Insufficient documentation

## 2024-05-27 DIAGNOSIS — Q270 Congenital absence and hypoplasia of umbilical artery: Secondary | ICD-10-CM | POA: Insufficient documentation

## 2024-05-27 LAB — WET PREP, GENITAL
Clue Cells Wet Prep HPF POC: NONE SEEN
Sperm: NONE SEEN
Trich, Wet Prep: NONE SEEN
WBC, Wet Prep HPF POC: <10 (ref <10)
Yeast Wet Prep HPF POC: NONE SEEN

## 2024-05-27 LAB — URINALYSIS, COMPLETE (UACMP) WITH MICROSCOPIC
Bilirubin Urine: NEGATIVE
Glucose, UA: NEGATIVE mg/dL
Hgb urine dipstick: NEGATIVE
Ketones, ur: NEGATIVE mg/dL
Leukocytes,Ua: NEGATIVE
Nitrite: NEGATIVE
Protein, ur: NEGATIVE mg/dL
Specific Gravity, Urine: 1.008 (ref 1.005–1.030)
pH: 6 (ref 5.0–8.0)

## 2024-05-27 LAB — CHLAMYDIA/NGC RT PCR (ARMC ONLY)
Chlamydia Tr: NOT DETECTED
N gonorrhoeae: NOT DETECTED

## 2024-05-27 MED ORDER — LACTATED RINGERS IV SOLN: 125.0000 mL/h | Freq: Once | INTRAVENOUS | Status: DC | PRN

## 2024-05-27 MED ORDER — CALCIUM CARBONATE ANTACID 500 MG PO CHEW: 2.0000 | CHEWABLE_TABLET | ORAL | Status: DC | PRN

## 2024-05-27 MED ORDER — ACETAMINOPHEN 500 MG PO TABS: 1000.0000 mg | ORAL_TABLET | Freq: Four times a day (QID) | ORAL | Status: DC | PRN

## 2024-05-27 MED ORDER — TERBUTALINE SULFATE 1 MG/ML IJ SOLN: 0.2500 mg | Freq: Once | INTRAMUSCULAR | Status: DC | PRN

## 2024-05-27 MED ORDER — LACTATED RINGERS IV SOLN: INTRAVENOUS | Status: DC

## 2024-05-27 NOTE — OB Triage Note (Signed)
 Patient is a 28 yo, G3P1, at 21 weeks 5 days. Patient presents with complaints of left lower abdominal pain and pelvic pressure.  Patient denies any vaginal bleeding or LOF. Patient denies any regular or consistent contractions. Patient reports the pain has been ongoing for approx 3 weeks. She is tearful and states she is unsure what is causing the pain but wants to be sure the baby is okay. Patient reports +FM. Monitors applied and assessing. VSS. Initial fetal heart tone 145 via doppler. Mackie CNM notified of patients arrival to unit. Plan to place in observation for wet prep, UA, GC/chlam, and toco monitoring.

## 2024-05-27 NOTE — Discharge Summary (Signed)
 Jaclyn Patterson is a 28 y.o. female. She is at [redacted]w[redacted]d gestation. No LMP recorded. Patient is pregnant. 10/02/2024, Date entered prior to episode creation   Prenatal care site: [facility]  Chief complaint: Left lower abd pain and sciatic pain   Admission Diagnoses:  1) intrauterine pregnancy at [redacted]w[redacted]d  2) Pelvic pressure in pregnancy, antepartum, second trimester [O26.892, R10.2]  Discharge Diagnoses:  Principal Problem:   Pelvic pressure in pregnancy, antepartum, second trimester Active Problems:   Sciatica of left side   Pain of round ligament during pregnancy    HPI: Jaclyn Patterson presents to L&D with complaints of left lower abdominal pain and left sciatic pain. She also reports intermittent pelvic pressure. Pain has been ongoing for approximately 3 weeks. Pain is mostly in LLQ and reports it as sharp, stabbing, and achy. Pain is intermittent, sometimes lasting a couple of minutes and sometimes up to 30 minutes. It's worse with activity and movement. Sciatic pain is also triggered by growing pregnancy and work. Her pregnancy is complicated by obesity, history of depression and anxiety, mild intermittent asthma, cardiac murmur (followed by cardiology), single umbilical artery, and echogenic bowel. .  She denies Contractions, Loss of fluid, or Vaginal bleeding. Endorses fetal movement as active.   S: Resting comfortably. no CTX, no VB.no LOF,  Active fetal movement.   Maternal Medical History:  Past Medical Hx:  has a past medical history of Anxiety, Asthma, Chronic pelvic pain in female (09/19/2017), and Depression.    Past Surgical Hx:  has a past surgical history that includes Dental surgery.   Allergies  Allergen Reactions   Almond (Diagnostic) Anaphylaxis     Prior to Admission medications   Medication Sig Start Date End Date Taking? Authorizing Provider  acetaminophen  (TYLENOL ) 325 MG tablet Take 2 tablets (650 mg total) by mouth every 4 (four) hours as needed (for pain  scale < 4). 08/09/22  Yes Wilson, Edsel Fuller, CNM  Prenatal MV-Min-Fe Fum-FA-DHA (PRENATAL 1 PO) Take 1 tablet by mouth Patterson.   Yes [provider]  sertraline  (ZOLOFT ) 100 MG tablet Take 100 mg by mouth Patterson.   Yes [provider]  acetaminophen  (TYLENOL ) 500 MG tablet Take 2 tablets (1,000 mg total) by mouth every 6 (six) hours as needed (for pain scale < 4  OR  temperature  >/=  100.5 F). 05/23/22   Vernel Therisa HERO, CNM  benzocaine -Menthol  (DERMOPLAST) 20-0.5 % AERO Apply 1 Application topically as needed for irritation (perineal discomfort). 08/09/22   Tanda Edsel Fuller, CNM  ibuprofen  (ADVIL ) 600 MG tablet Take 1 tablet (600 mg total) by mouth every 6 (six) hours. 08/09/22   Tanda Edsel Fuller, CNM  methocarbamol  (ROBAXIN ) 500 MG tablet Take 2 tablets (1,000 mg total) by mouth every 8 (eight) hours as needed. 10/21/23   Malvina Alm DASEN, MD  NIFEdipine  (ADALAT  CC) 30 MG 24 hr tablet Take 1 tablet (30 mg total) by mouth Patterson. 08/09/22   Tanda Edsel Fuller, CNM  ondansetron  (ZOFRAN ) 4 MG tablet Take 4 mg by mouth every 8 (eight) hours as needed for nausea. 06/10/22   [provider]  promethazine (PHENERGAN) 12.5 MG tablet Take 12.5 mg by mouth every 6 (six) hours as needed for nausea. 06/24/22   [provider]  senna-docusate (SENOKOT-S) 8.6-50 MG tablet Take 2 tablets by mouth at bedtime as needed for mild constipation. 08/09/22   Tanda Edsel Fuller, CNM  VENTOLIN  HFA 108 [phone] Base) MCG/ACT inhaler Inhale 1-2 puffs into the lungs every  4 (four) hours as needed for wheezing.    [provider]  witch hazel-glycerin  (TUCKS) pad Apply 1 Application topically as needed for hemorrhoids (for pain). 08/09/22   Tanda Edsel Fuller, CNM    Social History: She  reports that she has been smoking. She has never used smokeless tobacco. She reports current alcohol use. She reports that she does not use drugs.  Family History: family history includes  Diabetes in her maternal grandmother.   Review of Systems: A full review of systems was performed and negative except as noted in the HPI.     Pertinent Results:   O:  BP 116/64 (BP Location: Left Arm)   Pulse 70   Temp 98.4 F (36.9 C) (Oral)   Resp 18  Results for orders placed or performed during the hospital encounter of 05/27/24 (from the past 48 hours)  Wet prep, genital   Collection Time: 05/27/24  1:33 PM   Specimen: Urine, Clean Catch  Result Value Ref Range   Yeast Wet Prep HPF POC NONE SEEN NONE SEEN   Trich, Wet Prep NONE SEEN NONE SEEN   Clue Cells Wet Prep HPF POC NONE SEEN NONE SEEN   WBC, Wet Prep HPF POC <10 <10   Sperm NONE SEEN   Chlamydia/NGC rt PCR ([facility] only)   Collection Time: 05/27/24  1:33 PM   Specimen: Urine, Clean Catch; Genital  Result Value Ref Range   Specimen source GC/Chlam URINE, RANDOM    Chlamydia Tr NOT DETECTED NOT DETECTED   N gonorrhoeae NOT DETECTED NOT DETECTED  Urinalysis, Complete w Microscopic -Urine, Clean Catch   Collection Time: 05/27/24  1:33 PM  Result Value Ref Range   Color, Urine STRAW (A) YELLOW   APPearance CLEAR (A) CLEAR   Specific Gravity, Urine 1.008 1.005 - 1.030   pH 6.0 5.0 - 8.0   Glucose, UA NEGATIVE NEGATIVE mg/dL   Hgb urine dipstick NEGATIVE NEGATIVE   Bilirubin Urine NEGATIVE NEGATIVE   Ketones, ur NEGATIVE NEGATIVE mg/dL   Protein, ur NEGATIVE NEGATIVE mg/dL   Nitrite NEGATIVE NEGATIVE   Leukocytes,Ua NEGATIVE NEGATIVE   RBC / HPF 0-5 0 - 5 RBC/hpf   WBC, UA 0-5 0 - 5 WBC/hpf   Bacteria, UA FEW (A) NONE SEEN   Squamous Epithelial / HPF 6-10 0 - 5 /HPF   Mucus PRESENT     US  OB Limited Result Date: 05/27/2024 CLINICAL DATA:  Pelvic pressure in pregnancy. Assigned gestational age of [redacted] weeks, 5 days. EXAM: LIMITED OBSTETRIC ULTRASOUND COMPARISON:  Ultrasound dated 04/04/2024. FINDINGS: Number of Fetuses: 1 Heart Rate:  145 bpm Movement: Detected Presentation: Cephalic Placental Location: Anterior  Previa: No Amniotic Fluid (Subjective):  Within normal limits. BPD: 5 cm 21 w  3 d MATERNAL FINDINGS: Cervix:  Appears closed.  The cervical length is 3.7 cm. Uterus/Adnexae: No abnormality visualized. IMPRESSION: Single live intrauterine pregnancy.  No acute findings. This exam is performed on an emergent basis and does not comprehensively evaluate fetal size, dating, or anatomy; follow-up complete OB US  should be considered if further fetal assessment is warranted. Electronically Signed   By: Vanetta Chou M.D.   On: 05/27/2024 16:34    Constitutional: NAD, AAOx3  PULM: nl respiratory effort Abd: gravid, non-tender Ext: Non-tender, Nonedmeatous Psych: mood appropriate, speech normal Pelvic : deferred  Doppler: 145 bpm, distinguished from maternal heart rate   Consults: None  Procedures: OB US    Hospital Course: The patient was admitted to Labor and Delivery Triage  for observation. US  was reassuring with long cervical length. Wet prep and GC/CT were negative. UA was unremarkable. Pain was most consistent with sciatica in pregnancy and round ligament pain. Reviewed comfort measures and recommend maternity support belt. Discussed stretching, prenatal yoga, heat/ice, warm baths and showers, and menthol  rubs for muscles. Also discussed chiropractor for sciatica pain. List of chiropractors provided.   She was deemed stable for discharge and further outpatient management.   Discharge Condition: stable  Disposition: Discharge disposition: 01-Home or Self Care       Allergies as of 05/27/2024       Reactions   Almond (diagnostic) Anaphylaxis        Medication List     STOP taking these medications    benzocaine -Menthol  20-0.5 % Aero Commonly known as: DERMOPLAST   ibuprofen  600 MG tablet Commonly known as: ADVIL    methocarbamol  500 MG tablet Commonly known as: ROBAXIN    NIFEdipine  30 MG 24 hr tablet Commonly known as: ADALAT  CC   senna-docusate 8.6-50 MG  tablet Commonly known as: Senokot-S   witch hazel-glycerin  pad Commonly known as: TUCKS       TAKE these medications    acetaminophen  500 MG tablet Commonly known as: TYLENOL  Take 2 tablets (1,000 mg total) by mouth every 6 (six) hours as needed (for pain scale < 4  OR  temperature  >/=  100.5 F). What changed: Another medication with the same name was removed. Continue taking this medication, and follow the directions you see here.   ondansetron  4 MG tablet Commonly known as: ZOFRAN  Take 4 mg by mouth every 8 (eight) hours as needed for nausea.   PRENATAL 1 PO Take 1 tablet by mouth Patterson.   promethazine 12.5 MG tablet Commonly known as: PHENERGAN Take 12.5 mg by mouth every 6 (six) hours as needed for nausea.   sertraline  100 MG tablet Commonly known as: ZOLOFT  Take 100 mg by mouth Patterson.   Ventolin  HFA 108 (90 Base) MCG/ACT inhaler Generic drug: albuterol  Inhale 1-2 puffs into the lungs every 4 (four) hours as needed for wheezing.        Follow-up Information     [facility]. Schedule an appointment as soon as possible for a visit.   Why: routine prenatal appointment as scheduled Contact information: [address] [place]  [phone]               ----- Therisa CHRISTELLA Pillow, CNM  Certified Nurse Midwife [facility]

## 2024-05-27 NOTE — Progress Notes (Signed)
 Discharge instructions provided to patient. Patient verbalized understanding. Pt educated on signs and symptoms of labor, vaginal bleeding, LOF, and fetal movement. Red flag signs reviewed by RN. Patient discharged home with significant other in stable condition.

## 2024-05-27 NOTE — Discharge Instructions (Signed)
 Here are some chiropractors in the area who manage back pain in pregnancy:    [facility] (Drs. Larnell and Burnard Dye) Address: [address], [place], [place] [address] Phone: [phone]   [facility]) Address: [phone] [address] [place], [place] [address] Phone: [phone]  [facility]) Address: [address], [place], [place] [address] Phone: [phone]   [facility] Address: [address] [place] [facility] [place], [place] [address] Phone: [phone]  [facility] Address: [address], [place], [place] [address] Phone: [phone]  [facility]  Address: [address] [place], [place] [address] Phone: [phone]  [facility] Address:  [address] [place], [place], [place] [address] Phone: [phone]  [facility] and Acupuncture  Address: [address], [place], [place] [address] Phone: [phone]

## 2024-05-27 NOTE — Progress Notes (Signed)
Pt transported to US via transporter.

## 2024-06-07 NOTE — Progress Notes (Signed)
 Cultured cells from the amniotic fluid have been shared with the molecular lab at [facility] for cystic fibrosis testing and results are expected in 2-3 weeks.

## 2024-06-09 NOTE — Patient Instructions (Signed)
 Back and/or pelvic pain in pregnancy: - Take tylenol  as needed (1000 mg every 8 hours as needed) - Heat/ ice packs - Hot/ Biofreeze on your neck - Stay active! Exercise goal is 30 min 5 times a week - Pregnancy stretching and yoga - Try sleeping with a pregnancy/ body pillow - Wear a pregnancy support belt/ maternity belt (you can buy online at [facility]) - We can give you a prescription for a muscle relaxer (like flexeril) is needed

## 2024-06-14 ENCOUNTER — Ambulatory Visit: Attending: Obstetrics and Gynecology | Admitting: Physical Therapy

## 2024-06-14 DIAGNOSIS — M533 Sacrococcygeal disorders, not elsewhere classified: Secondary | ICD-10-CM | POA: Diagnosis present

## 2024-06-14 DIAGNOSIS — M5431 Sciatica, right side: Secondary | ICD-10-CM | POA: Diagnosis present

## 2024-06-14 DIAGNOSIS — R2689 Other abnormalities of gait and mobility: Secondary | ICD-10-CM | POA: Insufficient documentation

## 2024-06-14 DIAGNOSIS — M5432 Sciatica, left side: Secondary | ICD-10-CM | POA: Diagnosis present

## 2024-06-14 NOTE — Patient Instructions (Addendum)
 Emailed doula services via [facility] to patient  Discussed not wearing flip flops , buy shoes with good sole support.   __ Avoid straining pelvic floor, abdominal muscles , spine  Use log rolling technique instead of getting out of bed with your neck or the sit-up  Log rolling into and out of bed Log rolling into and out of bed If getting out of bed on R side, Bent knees, scoot hips/ shoulder to L  Raise R arm completely overhead, rolling onto armpit  Then lower bent knees to bed to get into complete side lying position  Then drop legs off bed, and push up onto R elbow/forearm, and use L hand to push onto the bed __ Proper body mechanics with getting out of a chair to decrease strain  on back &pelvic floor   Avoid holding your breath when Getting out of the chair:  Scoot to front part of chair chair Heels behind knees, feet are hip width apart, nose over toes  Inhale like you are smelling roses Exhale to stand  ___  Sitting with feet on ground, four points of contact Catch yourself crossing ankles and thighs  ___  Standing with equal weight on both legs   __  wear SIJ belt at work and while on your feet   __  Lengthen Back rib by L  shoulder ( winging)  Lie on R  side , pillow between knees and under head  Pull  L arm overhead over mattress, grab the edge of mattress,pull it upward, drawing elbow away from ears  Breathing 10 reps Brushing arm with 3/4 turn onto pillow behind back  Lying on R  side ,Pillow/ Block between knees  dragging top forearm across ribs below breast rotating 3/4 turn,  rotating  _L_ only this week ,  relax onto the pillow behind the back  and then back to other palm , maintain top palm on body whole top and not lift shoulder Do this side this week   15 reps        Wait do both sides until we have levelled out your spine and shoulders ___
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
## 2024-06-23 ENCOUNTER — Encounter: Payer: Self-pay | Admitting: Physical Therapy

## 2024-06-23 ENCOUNTER — Ambulatory Visit: Admitting: Physical Therapy

## 2024-06-23 DIAGNOSIS — R2689 Other abnormalities of gait and mobility: Secondary | ICD-10-CM

## 2024-06-23 DIAGNOSIS — M533 Sacrococcygeal disorders, not elsewhere classified: Secondary | ICD-10-CM | POA: Diagnosis not present

## 2024-06-23 DIAGNOSIS — M5432 Sciatica, left side: Secondary | ICD-10-CM

## 2024-06-23 DIAGNOSIS — M5431 Sciatica, right side: Secondary | ICD-10-CM
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
## 2024-07-20 ENCOUNTER — Ambulatory Visit: Attending: Obstetrics and Gynecology | Admitting: Physical Therapy

## 2024-07-20 ENCOUNTER — Encounter: Admitting: Physical Therapy

## 2024-07-20 ENCOUNTER — Telehealth: Payer: Self-pay | Admitting: Physical Therapy

## 2024-07-20 DIAGNOSIS — M5431 Sciatica, right side: Secondary | ICD-10-CM | POA: Insufficient documentation

## 2024-07-20 DIAGNOSIS — M533 Sacrococcygeal disorders, not elsewhere classified: Secondary | ICD-10-CM | POA: Insufficient documentation

## 2024-07-20 DIAGNOSIS — R2689 Other abnormalities of gait and mobility: Secondary | ICD-10-CM | POA: Insufficient documentation

## 2024-07-20 DIAGNOSIS — M5432 Sciatica, left side: Secondary | ICD-10-CM | POA: Insufficient documentation

## 2024-07-20 NOTE — Telephone Encounter (Signed)
 Physical therapist called pt re: missed appt today.  Pt reported she missed today's appt due to having an OB appt   Therapist explained  2 no-show appts per rehab department policy.   Pt confirmed she plans to come to her next appt

## 2024-07-27 ENCOUNTER — Encounter: Payer: Self-pay | Admitting: Obstetrics and Gynecology

## 2024-07-27 ENCOUNTER — Other Ambulatory Visit: Payer: Self-pay

## 2024-07-27 ENCOUNTER — Ambulatory Visit: Admitting: Physical Therapy

## 2024-07-27 ENCOUNTER — Observation Stay
Admission: RE | Admit: 2024-07-27 | Discharge: 2024-07-27 | Disposition: A | Discharge status: Home / Self Care | Attending: Obstetrics and Gynecology | Admitting: Obstetrics and Gynecology

## 2024-07-27 VITALS — BP 111/61 | HR 71

## 2024-07-27 DIAGNOSIS — O99213 Obesity complicating pregnancy, third trimester: Secondary | ICD-10-CM | POA: Insufficient documentation

## 2024-07-27 DIAGNOSIS — F329 Major depressive disorder, single episode, unspecified: Secondary | ICD-10-CM | POA: Insufficient documentation

## 2024-07-27 DIAGNOSIS — M5431 Sciatica, right side: Secondary | ICD-10-CM | POA: Diagnosis present

## 2024-07-27 DIAGNOSIS — O26893 Other specified pregnancy related conditions, third trimester: Principal | ICD-10-CM | POA: Insufficient documentation

## 2024-07-27 DIAGNOSIS — R2689 Other abnormalities of gait and mobility: Secondary | ICD-10-CM

## 2024-07-27 DIAGNOSIS — M5432 Sciatica, left side: Principal | ICD-10-CM

## 2024-07-27 DIAGNOSIS — E860 Dehydration: Secondary | ICD-10-CM | POA: Insufficient documentation

## 2024-07-27 DIAGNOSIS — O99513 Diseases of the respiratory system complicating pregnancy, third trimester: Secondary | ICD-10-CM | POA: Insufficient documentation

## 2024-07-27 DIAGNOSIS — E669 Obesity, unspecified: Secondary | ICD-10-CM | POA: Insufficient documentation

## 2024-07-27 DIAGNOSIS — O99343 Other mental disorders complicating pregnancy, third trimester: Secondary | ICD-10-CM | POA: Insufficient documentation

## 2024-07-27 DIAGNOSIS — M533 Sacrococcygeal disorders, not elsewhere classified: Secondary | ICD-10-CM | POA: Diagnosis present

## 2024-07-27 DIAGNOSIS — F419 Anxiety disorder, unspecified: Secondary | ICD-10-CM | POA: Insufficient documentation

## 2024-07-27 DIAGNOSIS — R102 Pelvic and perineal pain unspecified side: Secondary | ICD-10-CM

## 2024-07-27 DIAGNOSIS — J4520 Mild intermittent asthma, uncomplicated: Secondary | ICD-10-CM | POA: Insufficient documentation

## 2024-07-27 DIAGNOSIS — Q270 Congenital absence and hypoplasia of umbilical artery: Secondary | ICD-10-CM

## 2024-07-27 DIAGNOSIS — R42 Dizziness and giddiness: Secondary | ICD-10-CM | POA: Insufficient documentation

## 2024-07-27 DIAGNOSIS — O283 Abnormal ultrasonic finding on antenatal screening of mother: Secondary | ICD-10-CM | POA: Insufficient documentation

## 2024-07-27 DIAGNOSIS — Z3A30 30 weeks gestation of pregnancy: Secondary | ICD-10-CM | POA: Insufficient documentation

## 2024-07-27 LAB — URINALYSIS, COMPLETE (UACMP) WITH MICROSCOPIC
Bilirubin Urine: NEGATIVE
Glucose, UA: NEGATIVE mg/dL
Hgb urine dipstick: NEGATIVE
Ketones, ur: NEGATIVE mg/dL
Leukocytes,Ua: NEGATIVE
Nitrite: NEGATIVE
Protein, ur: NEGATIVE mg/dL
Specific Gravity, Urine: 1.017 (ref 1.005–1.030)
pH: 7 (ref 5.0–8.0)

## 2024-07-27 LAB — CBC
HCT: 36.1 % (ref 36.0–46.0)
Hemoglobin: 12.5 g/dL (ref 12.0–15.0)
MCH: 30.3 pg (ref 26.0–34.0)
MCHC: 34.6 g/dL (ref 30.0–36.0)
MCV: 87.4 fL (ref 80.0–100.0)
Platelets: 258 K/uL (ref 150–400)
RBC: 4.13 MIL/uL (ref 3.87–5.11)
RDW: 12.3 % (ref 11.5–15.5)
WBC: 11.5 K/uL — ABNORMAL HIGH (ref 4.0–10.5)
nRBC: 0 % (ref 0.0–0.2)

## 2024-07-27 LAB — RESP PANEL BY RT-PCR (RSV, FLU A&B, COVID)  RVPGX2
Influenza A by PCR: NEGATIVE
Influenza B by PCR: NEGATIVE
Resp Syncytial Virus by PCR: NEGATIVE
SARS Coronavirus 2 by RT PCR: NEGATIVE

## 2024-07-27 LAB — GLUCOSE, CAPILLARY: Glucose-Capillary: 112 mg/dL — ABNORMAL HIGH (ref 70–99)

## 2024-07-27 MED ORDER — LACTATED RINGERS IV SOLN: INTRAVENOUS | Status: DC

## 2024-07-27 MED ORDER — LACTATED RINGERS IV BOLUS
1000.0000 mL | Freq: Once | INTRAVENOUS | Status: AC
  Administered 2024-07-27: 1000 mL via INTRAVENOUS

## 2024-07-27 NOTE — Discharge Summary (Signed)
 Patient ID: Jaclyn Patterson MRN: [id number] DOB/AGE: [birth year] 27 y.o.  Admit date: 07/27/2024 Discharge date: 07/27/2024  Admission Diagnoses: 28 yo G3P1 presents with dizziness  Discharge Diagnoses: Dehydration  Factors complicating pregnancy: Echogenic bowel and 2VC (single UA) Obesity- pregravid BMI 30 Recurrent major depressive disorder, Anxiety Asthma- mild intermittent History of Grade 1 Cardiac Murmur w/ palpitations  Prenatal Procedures: NST  Consults: None  Significant Diagnostic Studies:  Results for orders placed or performed during the hospital encounter of 07/27/24 (from the past week)  Glucose, capillary   Collection Time: 07/27/24  1:04 PM  Result Value Ref Range   Glucose-Capillary 112 (H) 70 - 99 mg/dL  Resp panel by RT-PCR (RSV, Flu A&B, Covid) Anterior Nasal Swab   Collection Time: 07/27/24  1:07 PM   Specimen: Anterior Nasal Swab  Result Value Ref Range   SARS Coronavirus 2 by RT PCR NEGATIVE NEGATIVE   Influenza A by PCR NEGATIVE NEGATIVE   Influenza B by PCR NEGATIVE NEGATIVE   Resp Syncytial Virus by PCR NEGATIVE NEGATIVE  CBC   Collection Time: 07/27/24  1:28 PM  Result Value Ref Range   WBC 11.5 (H) 4.0 - 10.5 K/uL   RBC 4.13 3.87 - 5.11 MIL/uL   Hemoglobin 12.5 12.0 - 15.0 g/dL   HCT 63.8 63.9 - 53.9 %   MCV 87.4 80.0 - 100.0 fL   MCH 30.3 26.0 - 34.0 pg   MCHC 34.6 30.0 - 36.0 g/dL   RDW 87.6 88.4 - 84.4 %   Platelets 258 150 - 400 K/uL   nRBC 0.0 0.0 - 0.2 %  Urinalysis, Complete w Microscopic -Urine, Clean Catch   Collection Time: 07/27/24  1:28 PM  Result Value Ref Range   Color, Urine YELLOW (A) YELLOW   APPearance HAZY (A) CLEAR   Specific Gravity, Urine 1.017 1.005 - 1.030   pH 7.0 5.0 - 8.0   Glucose, UA NEGATIVE NEGATIVE mg/dL   Hgb urine dipstick NEGATIVE NEGATIVE   Bilirubin Urine NEGATIVE NEGATIVE   Ketones, ur NEGATIVE NEGATIVE mg/dL   Protein, ur NEGATIVE NEGATIVE mg/dL   Nitrite NEGATIVE NEGATIVE    Leukocytes,Ua NEGATIVE NEGATIVE   RBC / HPF 0-5 0 - 5 RBC/hpf   WBC, UA 0-5 0 - 5 WBC/hpf   Bacteria, UA RARE (A) NONE SEEN   Squamous Epithelial / HPF 0-5 0 - 5 /HPF   Mucus PRESENT    Amorphous Crystal PRESENT    Non Squamous Epithelial PRESENT (A) NONE SEEN   Sperm, UA PRESENT     Treatments: IV hydration  Hospital Course:  This is a 28 y.o. G3P1011 with IUP at [redacted]w[redacted]d seen for dizziness.  She was given IVF and reported feeling much better after her bolus.  She was observed, fetal heart rate monitoring remained reassuring, and she had no signs/symptoms of preterm labor or other maternal-fetal concerns.  She was deemed stable for discharge to home with outpatient follow up.  Discharge Physical Exam:  BP 116/64   Pulse 82   Temp 97.9 F (36.6 C) (Oral)   Resp 15   Ht 5' (1.524 m)   Wt 72.6 kg   BMI 31.25 kg/m   General: NAD CV: RRR Pulm: nl effort ABD: s/nd/nt, gravid DVT Evaluation: LE non-ttp, no evidence of DVT on exam.  NST: FHR baseline: 145 bpm Variability: moderate Accelerations: yes Decelerations: none Category/reactivity: reactive  TOCO: quiet SVE: deferred      Discharge Condition: Stable  Disposition:  Discharge disposition: 01-Home  or Self Care        Allergies as of 07/27/2024       Reactions   Almond (diagnostic) Anaphylaxis        Medication List     TAKE these medications    acetaminophen  500 MG tablet Commonly known as: TYLENOL  Take 2 tablets (1,000 mg total) by mouth every 6 (six) hours as needed (for pain scale < 4  OR  temperature  >/=  100.5 F).   ondansetron  4 MG tablet Commonly known as: ZOFRAN  Take 4 mg by mouth every 8 (eight) hours as needed for nausea.   PRENATAL 1 PO Take 1 tablet by mouth daily.   promethazine 12.5 MG tablet Commonly known as: PHENERGAN Take 12.5 mg by mouth every 6 (six) hours as needed for nausea.   sertraline  100 MG tablet Commonly known as: ZOLOFT  Take 100 mg by mouth daily.    Ventolin  HFA 108 (90 Base) MCG/ACT inhaler Generic drug: albuterol  Inhale 1-2 puffs into the lungs every 4 (four) hours as needed for wheezing.        Follow-up Information     [facility] Follow up.   Why: Keep all scheduled appointments, Call with any questions or concerns Contact information: [address] [place]  [address] [phone]                Signed:  DELON COE, CNM 07/27/2024 3:45 PM
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
## 2024-07-27 NOTE — OB Triage Note (Signed)
 Pt G3P1011 at [redacted]w[redacted]d presents from [facility] for dizziness, sweating and feeling like she is going to pass out. +FM. Denies LOF, bleeding, ctx. VSS. Pt reports having these episodes for the last two months mostly in the evenings and comes in waves. Pt reports having a heart murmur and is followed by cardiology. Pt reports she hasn't taken her zoloft  recently due to issue with prescription (issue has been fixed-she just needs to pick up).

## 2024-07-27 NOTE — OB Triage Note (Incomplete)
 Pt reports to labor and delivery with complaints of dizziness and feeling borderline syncopal. She did not lose consciousness

## 2024-08-18 ENCOUNTER — Ambulatory Visit: Attending: Obstetrics and Gynecology | Admitting: Physical Therapy

## 2024-08-25 ENCOUNTER — Ambulatory Visit: Admitting: Physical Therapy

## 2024-09-09 NOTE — L&D Delivery Note (Signed)
 Delivery Note At 3:07 PM a viable and healthy female Delsa was delivered via Vaginal, Spontaneous (Presentation:  ROA ).  APGAR: 8,9 ; weight  .   Placenta status: Spontaneous, Intact.  Cord: 3VC   Anesthesia: Epidural Episiotomy:  none Lacerations:  none Est. Blood Loss (mL):  100  Mom to postpartum.  Baby to Couplet care / Skin to Skin.  28yo G3P1011 at [redacted]w[redacted]d with iol elective, with miso +Cook cath. Pitocin  started and stopped with mod var +tachycardia, and after resolution the patient contracted without intervention. She entered the second stage of labor and pushed over an intact perineum. The baby delivered through a nuchal cord and was placed on the maternal abdomen.  A double true knot was in the cord. The cord was doubly clamped and cut by dad. THe placenta delivered spontaneously intact. No tears, minimal bleeding. Mom and baby tolerated the procedure well.  Heather Penton 09/26/2024, 3:21 PM

## 2024-09-14 ENCOUNTER — Encounter: Admitting: Physical Therapy

## 2024-09-14 LAB — OB RESULTS CONSOLE GC/CHLAMYDIA
Chlamydia: NEGATIVE
Neisseria Gonorrhea: NEGATIVE

## 2024-09-14 LAB — OB RESULTS CONSOLE GBS: GBS: NEGATIVE

## 2024-09-21 ENCOUNTER — Encounter: Admitting: Physical Therapy

## 2024-09-22 ENCOUNTER — Other Ambulatory Visit: Payer: Self-pay | Admitting: Obstetrics and Gynecology

## 2024-09-22 DIAGNOSIS — Z3490 Encounter for supervision of normal pregnancy, unspecified, unspecified trimester: Secondary | ICD-10-CM

## 2024-09-25 ENCOUNTER — Encounter: Payer: Self-pay | Admitting: Obstetrics and Gynecology

## 2024-09-25 ENCOUNTER — Other Ambulatory Visit: Payer: Self-pay

## 2024-09-25 ENCOUNTER — Inpatient Hospital Stay
Admission: EM | Admit: 2024-09-25 | Discharge: 2024-09-28 | DRG: 806 | Disposition: A | Attending: Obstetrics and Gynecology | Admitting: Obstetrics and Gynecology

## 2024-09-25 DIAGNOSIS — Z87891 Personal history of nicotine dependence: Secondary | ICD-10-CM | POA: Diagnosis not present

## 2024-09-25 DIAGNOSIS — D62 Acute posthemorrhagic anemia: Secondary | ICD-10-CM | POA: Diagnosis not present

## 2024-09-25 DIAGNOSIS — M5432 Sciatica, left side: Principal | ICD-10-CM

## 2024-09-25 DIAGNOSIS — Z79899 Other long term (current) drug therapy: Secondary | ICD-10-CM

## 2024-09-25 DIAGNOSIS — O9081 Anemia of the puerperium: Secondary | ICD-10-CM | POA: Diagnosis not present

## 2024-09-25 DIAGNOSIS — Z833 Family history of diabetes mellitus: Secondary | ICD-10-CM

## 2024-09-25 DIAGNOSIS — Z3A39 39 weeks gestation of pregnancy: Secondary | ICD-10-CM

## 2024-09-25 DIAGNOSIS — Q270 Congenital absence and hypoplasia of umbilical artery: Secondary | ICD-10-CM

## 2024-09-25 DIAGNOSIS — R102 Pelvic and perineal pain unspecified side: Secondary | ICD-10-CM

## 2024-09-25 DIAGNOSIS — O26893 Other specified pregnancy related conditions, third trimester: Secondary | ICD-10-CM | POA: Diagnosis present

## 2024-09-25 DIAGNOSIS — Z3490 Encounter for supervision of normal pregnancy, unspecified, unspecified trimester: Secondary | ICD-10-CM | POA: Diagnosis present

## 2024-09-25 DIAGNOSIS — O283 Abnormal ultrasonic finding on antenatal screening of mother: Secondary | ICD-10-CM

## 2024-09-25 DIAGNOSIS — O26899 Other specified pregnancy related conditions, unspecified trimester: Secondary | ICD-10-CM

## 2024-09-25 LAB — CBC
HCT: 32.4 % — ABNORMAL LOW (ref 36.0–46.0)
Hemoglobin: 11.1 g/dL — ABNORMAL LOW (ref 12.0–15.0)
MCH: 29.8 pg (ref 26.0–34.0)
MCHC: 34.3 g/dL (ref 30.0–36.0)
MCV: 86.9 fL (ref 80.0–100.0)
Platelets: 246 K/uL (ref 150–400)
RBC: 3.73 MIL/uL — ABNORMAL LOW (ref 3.87–5.11)
RDW: 13.2 % (ref 11.5–15.5)
WBC: 7.7 K/uL (ref 4.0–10.5)
nRBC: 0 % (ref 0.0–0.2)

## 2024-09-25 LAB — TYPE AND SCREEN
ABO/RH(D): A POS
Antibody Screen: NEGATIVE

## 2024-09-25 MED ORDER — OXYCODONE-ACETAMINOPHEN 5-325 MG PO TABS: 2.0000 | ORAL_TABLET | ORAL | Status: DC | PRN

## 2024-09-25 MED ORDER — MISOPROSTOL 25 MCG QUARTER TABLET
25.0000 ug | ORAL_TABLET | ORAL | Status: DC
  Administered 2024-09-25: 25 ug via ORAL
  Filled 2024-09-25: qty 1

## 2024-09-25 MED ORDER — LACTATED RINGERS IV SOLN: INTRAVENOUS | Status: DC

## 2024-09-25 MED ORDER — LIDOCAINE HCL (PF) 1 % IJ SOLN: 30.0000 mL | INTRAMUSCULAR | Status: DC | PRN

## 2024-09-25 MED ORDER — OXYCODONE-ACETAMINOPHEN 5-325 MG PO TABS
1.0000 | ORAL_TABLET | ORAL | Status: DC | PRN
  Administered 2024-09-26: 1 via ORAL
  Filled 2024-09-25: qty 1

## 2024-09-25 MED ORDER — FENTANYL CITRATE (PF) 100 MCG/2ML IJ SOLN
50.0000 ug | INTRAMUSCULAR | Status: DC | PRN
  Administered 2024-09-25: 100 ug via INTRAVENOUS
  Filled 2024-09-25: qty 2

## 2024-09-25 MED ORDER — ACETAMINOPHEN 325 MG PO TABS
650.0000 mg | ORAL_TABLET | ORAL | Status: DC | PRN
  Administered 2024-09-25: 650 mg via ORAL
  Filled 2024-09-25: qty 2

## 2024-09-25 MED ORDER — OXYTOCIN-SODIUM CHLORIDE 30-0.9 UT/500ML-% IV SOLN
1.0000 m[IU]/min | INTRAVENOUS | Status: DC
  Administered 2024-09-25 – 2024-09-26 (×2): 1 m[IU]/min via INTRAVENOUS
  Filled 2024-09-25: qty 500

## 2024-09-25 MED ORDER — MISOPROSTOL 200 MCG PO TABS
ORAL_TABLET | ORAL | Status: AC
  Filled 2024-09-25: qty 4

## 2024-09-25 MED ORDER — CALCIUM CARBONATE ANTACID 500 MG PO CHEW
CHEWABLE_TABLET | ORAL | Status: AC
  Administered 2024-09-26: 200 mg via ORAL
  Filled 2024-09-25: qty 2

## 2024-09-25 MED ORDER — OXYTOCIN-SODIUM CHLORIDE 30-0.9 UT/500ML-% IV SOLN
2.5000 [IU]/h | INTRAVENOUS | Status: DC
  Administered 2024-09-26: 2.5 [IU]/h via INTRAVENOUS

## 2024-09-25 MED ORDER — TERBUTALINE SULFATE 1 MG/ML IJ SOLN: 0.2500 mg | Freq: Once | INTRAMUSCULAR | Status: DC | PRN

## 2024-09-25 MED ORDER — OXYTOCIN 10 UNIT/ML IJ SOLN
INTRAMUSCULAR | Status: AC
  Filled 2024-09-25: qty 2

## 2024-09-25 MED ORDER — MISOPROSTOL 25 MCG QUARTER TABLET
25.0000 ug | ORAL_TABLET | ORAL | Status: DC
  Administered 2024-09-25: 25 ug via VAGINAL
  Filled 2024-09-25: qty 1

## 2024-09-25 MED ORDER — ONDANSETRON HCL 4 MG/2ML IJ SOLN
4.0000 mg | Freq: Four times a day (QID) | INTRAMUSCULAR | Status: DC | PRN
  Administered 2024-09-26: 4 mg via INTRAVENOUS
  Filled 2024-09-25: qty 2

## 2024-09-25 MED ORDER — CALCIUM CARBONATE ANTACID 500 MG PO CHEW
1.0000 | CHEWABLE_TABLET | Freq: Three times a day (TID) | ORAL | Status: DC
  Administered 2024-09-25: 400 mg via ORAL
  Filled 2024-09-25: qty 2

## 2024-09-25 MED ORDER — LACTATED RINGERS IV SOLN
500.0000 mL | INTRAVENOUS | Status: DC | PRN
  Administered 2024-09-26 (×2): 500 mL via INTRAVENOUS

## 2024-09-25 MED ORDER — OXYTOCIN BOLUS FROM INFUSION
333.0000 mL | Freq: Once | INTRAVENOUS | Status: AC
  Administered 2024-09-26: 333 mL via INTRAVENOUS

## 2024-09-25 MED ORDER — SOD CITRATE-CITRIC ACID 500-334 MG/5ML PO SOLN: 30.0000 mL | ORAL | Status: DC | PRN

## 2024-09-25 MED ORDER — SERTRALINE HCL 50 MG PO TABS
50.0000 mg | ORAL_TABLET | Freq: Every day | ORAL | Status: DC
  Administered 2024-09-25: 50 mg via ORAL
  Filled 2024-09-25: qty 1

## 2024-09-25 MED ORDER — AMMONIA AROMATIC IN INHA
RESPIRATORY_TRACT | Status: AC
  Filled 2024-09-25: qty 10

## 2024-09-25 MED ORDER — LIDOCAINE HCL (PF) 1 % IJ SOLN
INTRAMUSCULAR | Status: AC
  Filled 2024-09-25: qty 30

## 2024-09-25 NOTE — Plan of Care (Signed)
" °  Problem: Education: Goal: Knowledge of Childbirth will improve Outcome: Progressing Goal: Ability to make informed decisions regarding treatment and plan of care will improve Outcome: Progressing Goal: Ability to state and carry out methods to decrease the pain will improve Outcome: Progressing   Problem: Coping: Goal: Ability to verbalize concerns and feelings about labor and delivery will improve Outcome: Progressing   Problem: Life Cycle: Goal: Ability to make normal progression through stages of labor will improve Outcome: Progressing Goal: Ability to effectively push during vaginal delivery will improve Outcome: Progressing   Problem: Role Relationship: Goal: Will demonstrate positive interactions with the child Outcome: Progressing   Problem: Safety: Goal: Risk of complications during labor and delivery will decrease Outcome: Progressing   Problem: Pain Management: Goal: Relief or control of pain from uterine contractions will improve Outcome: Progressing   Problem: Education: Goal: Knowledge of General Education information will improve Description: Including pain rating scale, medication(s)/side effects and non-pharmacologic comfort measures Outcome: Progressing   Problem: Health Behavior/Discharge Planning: Goal: Ability to manage health-related needs will improve Outcome: Progressing   Problem: Clinical Measurements: Goal: Ability to maintain clinical measurements within normal limits will improve Outcome: Progressing Goal: Will remain free from infection Outcome: Progressing Goal: Diagnostic test results will improve Outcome: Progressing Goal: Respiratory complications will improve Outcome: Progressing Goal: Cardiovascular complication will be avoided Outcome: Progressing   Problem: Activity: Goal: Risk for activity intolerance will decrease Outcome: Progressing   Problem: Nutrition: Goal: Adequate nutrition will be maintained Outcome:  Progressing   Problem: Coping: Goal: Level of anxiety will decrease Outcome: Progressing   Problem: Elimination: Goal: Will not experience complications related to bowel motility Outcome: Progressing Goal: Will not experience complications related to urinary retention Outcome: Progressing   Problem: Pain Managment: Goal: General experience of comfort will improve and/or be controlled Outcome: Progressing   Problem: Safety: Goal: Ability to remain free from injury will improve Outcome: Progressing   Problem: Skin Integrity: Goal: Risk for impaired skin integrity will decrease Outcome: Progressing   "

## 2024-09-25 NOTE — Anesthesia Preprocedure Evaluation (Signed)
"                                    Anesthesia Evaluation  Patient identified by MRN, date of birth, ID band Patient awake    Reviewed: Allergy & Precautions, H&P , NPO status , Patient's Chart, lab work & pertinent test results  Airway Mallampati: II  TM Distance: >3 FB Neck ROM: Full    Dental no notable dental hx.    Pulmonary neg pulmonary ROS, former smoker   Pulmonary exam normal breath sounds clear to auscultation       Cardiovascular hypertension, negative cardio ROS Normal cardiovascular exam Rhythm:Regular Rate:Normal     Neuro/Psych negative neurological ROS  negative psych ROS   GI/Hepatic negative GI ROS, Neg liver ROS,,,  Endo/Other  negative endocrine ROS    Renal/GU negative Renal ROS  negative genitourinary   Musculoskeletal negative musculoskeletal ROS (+)    Abdominal   Peds negative pediatric ROS (+)  Hematology negative hematology ROS (+)   Anesthesia Other Findings   Reproductive/Obstetrics negative OB ROS                              Anesthesia Physical Anesthesia Plan  ASA: 2  Anesthesia Plan: Epidural   Post-op Pain Management:    Induction: Intravenous  PONV Risk Score and Plan:   Airway Management Planned:   Additional Equipment:   Intra-op Plan:   Post-operative Plan: Extubation in OR  Informed Consent: I have reviewed the patients History and Physical, chart, labs and discussed the procedure including the risks, benefits and alternatives for the proposed anesthesia with the patient or authorized representative who has indicated his/her understanding and acceptance.     Dental advisory given  Plan Discussed with: CRNA  Anesthesia Plan Comments:         Anesthesia Quick Evaluation  "
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
## 2024-09-25 NOTE — Progress Notes (Addendum)
 SPIRITUAL CARE AND COUNSELING CONSULT NOTE   VISIT SUMMARY Chaplain explained AD to patient per [facility] Consult in the system.  Patient was happy to know that her spouse is the legal decision maker and said Chaplain could just leave the document for their review.  Patient and spouse shared pictures and joy with Chaplain about their children.                                                                                                                                                      Type of Visit: Initial Care provided to:: Pt and family Conversation partners present during encounter: Nurse Referral source: Patient request Reason for visit: Advance directives OnCall Visit: Yes   SPIRITUAL FRAMEWORK      GOALS       INTERVENTIONS        INTERVENTION OUTCOMES      SPIRITUAL CARE PLAN        If immediate needs arise, please contact [facility] 24 hour on call [phone].   Hart Moats, Chaplain  09/25/2024 2:09 PM

## 2024-09-25 NOTE — Progress Notes (Signed)
 Jaclyn Patterson is a 29 y.o. G3P1011 at [redacted]w[redacted]d  - s/p 25mcg buccal, 25mcg vaginal miso - feeling 4/10 contractions    Objective: BP 130/70   Pulse 79   Temp 98.5 F (36.9 C) (Oral)   Resp 16   Ht 5' (1.524 m)   Wt 79.4 kg   BMI 34.18 kg/m  No intake/output data recorded. No intake/output data recorded.  FHT:  FHR: 130 bpm, variability: moderate,  accelerations:  Present,  decelerations:  Absent UC:   regular, every 2-3 minutes SVE:   Dilation: 1 Effacement (%): 50 Station: -3 Exam by:: Verdon, MD  Labs: Lab Results  Component Value Date   WBC 7.7 09/25/2024   HGB 11.1 (L) 09/25/2024   HCT 32.4 (L) 09/25/2024   MCV 86.9 09/25/2024   PLT 246 09/25/2024    Assessment / Plan: Cook cath placed with 80cc in each balloon. Will start low dose pitocin  1 by 1 NSVD anticipated  Heather Verdon, MD 09/25/2024, 10:50 PM

## 2024-09-26 ENCOUNTER — Inpatient Hospital Stay: Payer: Self-pay

## 2024-09-26 ENCOUNTER — Encounter: Payer: Self-pay | Admitting: Obstetrics and Gynecology

## 2024-09-26 LAB — SYPHILIS: RPR W/REFLEX TO RPR TITER AND TREPONEMAL ANTIBODIES, TRADITIONAL SCREENING AND DIAGNOSIS ALGORITHM: RPR Ser Ql: NONREACTIVE

## 2024-09-26 MED ORDER — EPHEDRINE 5 MG/ML INJ
10.0000 mg | INTRAVENOUS | Status: DC | PRN
  Administered 2024-09-26: 10 mg via INTRAVENOUS

## 2024-09-26 MED ORDER — WITCH HAZEL-GLYCERIN EX PADS
1.0000 | MEDICATED_PAD | CUTANEOUS | Status: DC | PRN
  Administered 2024-09-26: 1 via TOPICAL
  Filled 2024-09-26 (×2): qty 100

## 2024-09-26 MED ORDER — SENNOSIDES-DOCUSATE SODIUM 8.6-50 MG PO TABS
2.0000 | ORAL_TABLET | ORAL | Status: DC
  Administered 2024-09-26 – 2024-09-27 (×2): 2 via ORAL
  Filled 2024-09-26 (×2): qty 2

## 2024-09-26 MED ORDER — DIBUCAINE (PERIANAL) 1 % EX OINT: 1.0000 | TOPICAL_OINTMENT | CUTANEOUS | Status: DC | PRN

## 2024-09-26 MED ORDER — PRENATAL MULTIVITAMIN CH
1.0000 | ORAL_TABLET | Freq: Every day | ORAL | Status: DC
  Administered 2024-09-27 – 2024-09-28 (×2): 1 via ORAL
  Filled 2024-09-26 (×2): qty 1

## 2024-09-26 MED ORDER — MEASLES, MUMPS & RUBELLA VAC ~~LOC~~ SUSR: 0.5000 mL | Freq: Once | SUBCUTANEOUS | Status: DC

## 2024-09-26 MED ORDER — BISACODYL 10 MG RE SUPP: 10.0000 mg | Freq: Every day | RECTAL | Status: DC | PRN

## 2024-09-26 MED ORDER — DIPHENHYDRAMINE HCL 50 MG/ML IJ SOLN
12.5000 mg | INTRAMUSCULAR | Status: AC | PRN
  Administered 2024-09-26 (×3): 12.5 mg via INTRAVENOUS
  Filled 2024-09-26 (×2): qty 1

## 2024-09-26 MED ORDER — EPHEDRINE 5 MG/ML INJ
10.0000 mg | INTRAVENOUS | Status: DC | PRN
  Filled 2024-09-26: qty 5

## 2024-09-26 MED ORDER — SERTRALINE HCL 100 MG PO TABS
100.0000 mg | ORAL_TABLET | Freq: Every day | ORAL | Status: DC
  Administered 2024-09-26: 100 mg via ORAL
  Filled 2024-09-26: qty 1

## 2024-09-26 MED ORDER — ALBUTEROL SULFATE (2.5 MG/3ML) 0.083% IN NEBU: 3.0000 mL | INHALATION_SOLUTION | RESPIRATORY_TRACT | Status: DC | PRN

## 2024-09-26 MED ORDER — SIMETHICONE 80 MG PO CHEW: 80.0000 mg | CHEWABLE_TABLET | ORAL | Status: DC | PRN

## 2024-09-26 MED ORDER — ONDANSETRON HCL 4 MG/2ML IJ SOLN: 4.0000 mg | INTRAMUSCULAR | Status: DC | PRN

## 2024-09-26 MED ORDER — COCONUT OIL OIL
1.0000 | TOPICAL_OIL | Status: DC | PRN
  Filled 2024-09-26: qty 15

## 2024-09-26 MED ORDER — SODIUM CHLORIDE 0.9% FLUSH: 3.0000 mL | INTRAVENOUS | Status: DC | PRN

## 2024-09-26 MED ORDER — SODIUM CHLORIDE 0.9% FLUSH
3.0000 mL | Freq: Two times a day (BID) | INTRAVENOUS | Status: DC
  Administered 2024-09-26 – 2024-09-27 (×3): 3 mL via INTRAVENOUS

## 2024-09-26 MED ORDER — LIDOCAINE-EPINEPHRINE (PF) 1.5 %-1:200000 IJ SOLN
INTRAMUSCULAR | Status: DC | PRN
  Administered 2024-09-26 (×2): 5 mL via PERINEURAL

## 2024-09-26 MED ORDER — TETANUS-DIPHTH-ACELL PERTUSSIS 5-2-15.5 LF-MCG/0.5 IM SUSP: 0.5000 mL | Freq: Once | INTRAMUSCULAR | Status: DC

## 2024-09-26 MED ORDER — DIPHENHYDRAMINE HCL 25 MG PO CAPS: 25.0000 mg | ORAL_CAPSULE | Freq: Four times a day (QID) | ORAL | Status: DC | PRN

## 2024-09-26 MED ORDER — PHENYLEPHRINE 80 MCG/ML (10ML) SYRINGE FOR IV PUSH (FOR BLOOD PRESSURE SUPPORT): 80.0000 ug | PREFILLED_SYRINGE | INTRAVENOUS | Status: DC | PRN

## 2024-09-26 MED ORDER — OXYCODONE HCL 5 MG PO TABS
5.0000 mg | ORAL_TABLET | ORAL | Status: DC | PRN
  Administered 2024-09-26 – 2024-09-27 (×4): 5 mg via ORAL
  Filled 2024-09-26 (×4): qty 1

## 2024-09-26 MED ORDER — SODIUM CHLORIDE 0.9 % IV SOLN: 250.0000 mL | INTRAVENOUS | Status: DC | PRN

## 2024-09-26 MED ORDER — IBUPROFEN 600 MG PO TABS
600.0000 mg | ORAL_TABLET | Freq: Four times a day (QID) | ORAL | Status: DC
  Administered 2024-09-26 – 2024-09-28 (×7): 600 mg via ORAL
  Filled 2024-09-26 (×7): qty 1

## 2024-09-26 MED ORDER — FENTANYL-BUPIVACAINE-NACL 0.5-0.125-0.9 MG/250ML-% EP SOLN
12.0000 mL/h | EPIDURAL | Status: DC | PRN
  Administered 2024-09-26: 12 mL/h via EPIDURAL
  Filled 2024-09-26: qty 250

## 2024-09-26 MED ORDER — FLEET ENEMA RE ENEM: 1.0000 | ENEMA | Freq: Every day | RECTAL | Status: DC | PRN

## 2024-09-26 MED ORDER — ZOLPIDEM TARTRATE 5 MG PO TABS: 5.0000 mg | ORAL_TABLET | Freq: Every evening | ORAL | Status: DC | PRN

## 2024-09-26 MED ORDER — LACTATED RINGERS IV SOLN
500.0000 mL | Freq: Once | INTRAVENOUS | Status: AC
  Administered 2024-09-26: 500 mL via INTRAVENOUS

## 2024-09-26 MED ORDER — BENZOCAINE-MENTHOL 20-0.5 % EX AERO
1.0000 | INHALATION_SPRAY | CUTANEOUS | Status: DC | PRN
  Administered 2024-09-26: 1 via TOPICAL
  Filled 2024-09-26 (×2): qty 56

## 2024-09-26 MED ORDER — ONDANSETRON HCL 4 MG PO TABS: 4.0000 mg | ORAL_TABLET | ORAL | Status: DC | PRN

## 2024-09-26 MED ORDER — ACETAMINOPHEN 325 MG PO TABS
650.0000 mg | ORAL_TABLET | ORAL | Status: DC | PRN
  Administered 2024-09-27 – 2024-09-28 (×3): 650 mg via ORAL
  Filled 2024-09-26 (×4): qty 2

## 2024-09-26 NOTE — Progress Notes (Signed)
 Jaclyn Patterson is a 29 y.o. G3P1011 at [redacted]w[redacted]d   Subjective: S/p cook cath, misoprostol .  Feeling comfortable with epidural, reports hot spot has resolved. Sometimes feeling pressure on left side.  Objective: BP (!) 107/53   Pulse 83   Temp 98.9 F (37.2 C) (Oral)   Resp 14   Ht 5' (1.524 m)   Wt 79.4 kg   SpO2 98%   BMI 34.18 kg/m  I/O last 3 completed shifts: In: -  Out: 875 [Urine:875] No intake/output data recorded.  FHT:  FHR: 160 bpm, variability: moderate,  accelerations:  Present,  decelerations:  Present variable. A period of lates that has resolved. Also, a period of tachycardia that is resolving, with good variability throughout UC:   regular, every 4 minutes SVE:   Dilation: 5.5 Effacement (%): 70, 80 Station: -1 Exam by:: K. Cevallos RN  Labs: Lab Results  Component Value Date   WBC 7.7 09/25/2024   HGB 11.1 (L) 09/25/2024   HCT 32.4 (L) 09/25/2024   MCV 86.9 09/25/2024   PLT 246 09/25/2024    Assessment / Plan:  Not currently ruptured and not on pitocin . Fetal tachycardia resolving with position changes and bolus. Will continue intrauterine resuscitation. Consider AROM with internal monitors if no change.  Will assess cervix once acuity on the floor has resolved.  Start pitocin  as clinically appropriate.  Heather Penton, MD 09/26/2024, 11:29 AM

## 2024-09-26 NOTE — Anesthesia Procedure Notes (Signed)
 Epidural Patient location during procedure: OB Start time: 09/26/2024 8:21 AM End time: 09/26/2024 8:22 AM  Staffing Anesthesiologist: Chesley Lendia CROME, MD Performed: anesthesiologist   Preanesthetic Checklist Completed: patient identified, IV checked, site marked, risks and benefits discussed, surgical consent, monitors and equipment checked, pre-op evaluation and timeout performed  Epidural Patient position: sitting Prep: ChloraPrep Patient monitoring: heart rate, continuous pulse ox and blood pressure Approach: midline Location: L3-L4 Injection technique: LOR saline  Needle:  Needle insertion depth (cm): 6.5 Needle type: Tuohy  Needle gauge: 17 G Needle length: 9 cm and 9 Needle insertion depth: 6.5 cm Catheter type: closed end flexible Catheter size: 19 Gauge Catheter at skin depth: 12 cm Test dose: negative and 1.5% lidocaine  with Epi 1:200 K  Assessment Sensory level: T10 Events: blood not aspirated, injection not painful, no injection resistance, no paresthesia and negative IV test  Additional Notes 1 attempt Pt. Evaluated and documentation done after procedure finished. Patient identified. Risks/Benefits/Options discussed with patient including but not limited to bleeding, infection, nerve damage, paralysis, failed block, incomplete pain control, headache, blood pressure changes, nausea, vomiting, reactions to medication both or allergic, itching and postpartum back pain. Confirmed with bedside nurse the patient's most recent platelet count. Confirmed with patient that they are not currently taking any anticoagulation, have any bleeding history or any family history of bleeding disorders. Patient expressed understanding and wished to proceed. All questions were answered. Sterile technique was used throughout the entire procedure. Please see nursing notes for vital signs. Test dose was given through epidural catheter and negative prior to continuing to dose epidural or start  infusion. Warning signs of high block given to the patient including shortness of breath, tingling/numbness in hands, complete motor block, or any concerning symptoms with instructions to call for help. Patient was given instructions on fall risk and not to get out of bed. All questions and concerns addressed with instructions to call with any issues or inadequate analgesia.    Patient tolerated the insertion well without immediate complications. Reason for block:procedure for pain

## 2024-09-26 NOTE — Anesthesia Procedure Notes (Signed)
 Epidural Patient location during procedure: OB Start time: 09/26/2024 12:09 AM  Staffing Anesthesiologist: Ariel Wingrove, Fairy LABOR, MD Performed: anesthesiologist   Preanesthetic Checklist Completed: patient identified, IV checked, site marked, risks and benefits discussed, surgical consent, monitors and equipment checked, pre-op evaluation and timeout performed  Epidural Patient position: sitting Prep: Betadine Patient monitoring: heart rate, continuous pulse ox and blood pressure Approach: midline Location: L4-L5 Injection technique: LOR saline  Needle:  Needle type: Tuohy  Needle gauge: 17 G Needle length: 9 cm and 9 Needle insertion depth: 7 cm Catheter type: closed end flexible Catheter size: 19 Gauge Catheter at skin depth: 13 cm Test dose: negative and 1.5% lidocaine  with Epi 1:200 K  Assessment Sensory level: T6 Events: blood not aspirated, no cerebrospinal fluid, injection not painful, no injection resistance, no paresthesia and negative IV test  Additional Notes   Patient tolerated the insertion well without complications.Reason for block:procedure for pain
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
## 2024-09-26 NOTE — Progress Notes (Signed)
 Jaclyn Patterson is a 29 y.o. G3P1011 at [redacted]w[redacted]d   Objective: BP (!) 100/47   Pulse 83   Temp 98.9 F (37.2 C) (Oral)   Resp 14   Ht 5' (1.524 m)   Wt 79.4 kg   SpO2 98%   BMI 34.18 kg/m  I/O last 3 completed shifts: In: -  Out: 875 [Urine:875] Total I/O In: -  Out: 750 [Urine:750]  FHT:  FHR: 160 bpm, variability: moderate,  accelerations:  Present,  decelerations:  Absent UC:   regular, every 3-4 minutes, on no pitocin  SVE:   Dilation: 7 Effacement (%): 90 Station: 0 Exam by:: Dr. Verdon  Labs: Lab Results  Component Value Date   WBC 7.7 09/25/2024   HGB 11.1 (L) 09/25/2024   HCT 32.4 (L) 09/25/2024   MCV 86.9 09/25/2024   PLT 246 09/25/2024    Assessment / Plan:  AROM during cervical exam, thin meconium. Contracting well, continue labor Expect NSVD  Heather Verdon, MD 09/26/2024, 1:05 PM

## 2024-09-27 LAB — CBC
HCT: 27 % — ABNORMAL LOW (ref 36.0–46.0)
HCT: 29.4 % — ABNORMAL LOW (ref 36.0–46.0)
Hemoglobin: 9.1 g/dL — ABNORMAL LOW (ref 12.0–15.0)
Hemoglobin: 10.1 g/dL — ABNORMAL LOW (ref 12.0–15.0)
MCH: 29.4 pg (ref 26.0–34.0)
MCH: 29.7 pg (ref 26.0–34.0)
MCHC: 33.7 g/dL (ref 30.0–36.0)
MCHC: 34.4 g/dL (ref 30.0–36.0)
MCV: 87.4 fL (ref 80.0–100.0)
MCV: 86.5 fL (ref 80.0–100.0)
Platelets: 191 K/uL (ref 150–400)
Platelets: 216 K/uL (ref 150–400)
RBC: 3.09 MIL/uL — ABNORMAL LOW (ref 3.87–5.11)
RBC: 3.4 MIL/uL — ABNORMAL LOW (ref 3.87–5.11)
RDW: 13.5 % (ref 11.5–15.5)
RDW: 13.5 % (ref 11.5–15.5)
WBC: 13 K/uL — ABNORMAL HIGH (ref 4.0–10.5)
WBC: 12.1 K/uL — ABNORMAL HIGH (ref 4.0–10.5)
nRBC: 0 % (ref 0.0–0.2)
nRBC: 0 % (ref 0.0–0.2)

## 2024-09-27 MED ORDER — SERTRALINE HCL 100 MG PO TABS
100.0000 mg | ORAL_TABLET | Freq: Every day | ORAL | Status: DC
  Administered 2024-09-27: 100 mg via ORAL
  Filled 2024-09-27: qty 1

## 2024-09-27 MED ORDER — METHYLERGONOVINE MALEATE 0.2 MG PO TABS
0.2000 mg | ORAL_TABLET | Freq: Four times a day (QID) | ORAL | Status: DC
  Administered 2024-09-27 – 2024-09-28 (×4): 0.2 mg via ORAL
  Filled 2024-09-27 (×4): qty 1

## 2024-09-27 NOTE — Lactation Note (Signed)
 This note was copied from a baby's chart. Lactation Consultation Note  Patient Name: Jaclyn Patterson Date: 09/27/2024 Age:28 hours Reason for consult: Initial assessment;Mother's request;Term;Breastfeeding assistance   Maternal Data This is mom's 2nd baby, SVD. Mom with history of major depressive disorder, generalized anxiety disorder, and post partum depression.  On initial visit mom reports she experienced dysphoric milk ejection reflex when breastfeeding her previous baby. Per mom she would get butterflies in her stomach, would feel anxious, and would find herself crying each time she breastfed. She did not experience this when pumping. Mom reports she wanted her baby to get breastmilk so she endured the feelings she had when breastfeeding. Her concern is she is experiencing these feelings again when breastfeeding and asked if there is anything that she can do to help this. Also mom requested LC check baby's latch as mom's nipples are becoming tender.  Has patient been taught Hand Expression?: Yes Does the patient have breastfeeding experience prior to this delivery?: Yes How long did the patient breastfeed?: mom exclusively breastfed for 7 months.  Feeding Mother's Current Feeding Choice: Breast Milk Provided mom with tips and strategies to maximize latch techniques. Baby's lips were rolled inward. Mom was able to correct latch and roll lips outward on to the areola. Baby with multiple swallows noted by mom and LC and was content after feeding.  LC provided mom with tips and strategies to assist with D-MER. Reviewed with mom and FOB D-MER education to help best understand what and why mom experiences this. After baby latched mom reported feeling butterflies in her stomach. LC encouraged mom to drink some ice water as baby was breastfeeding. Mom drank ice water 5 times during the feeding. Per mom she reports she did not feel the anxiety come on like it has been and on a scale of  1-10 she reported maybe a 2. Recommended mom use distraction techniques including drinking ice water, deep breathing, and trying to remember this is transient. Also, discussed the importance of self care, resting when the baby is resting, taking in adequate fluid and calories, avoiding/minimizing caffeine, avoiding too long of a duration between feedings. Provided educational handout about what helps and what hinders D-MER. Discussed with mom to report to her Provider any concerns or worsening of her D-MER symptoms. Also, discussed with mom option to pump at some feeding sessions, especially during the day as she will also have a 28 year old to care for, as mom reports when pumping she did not experience D-MER. Mom will consider this option. LATCH Score Latch: Grasps breast easily, tongue down, lips flanged, rhythmical sucking.  Audible Swallowing: Spontaneous and intermittent  Type of Nipple: Everted at rest and after stimulation  Comfort (Breast/Nipple): Filling, red/small blisters or bruises, mild/mod discomfort  Hold (Positioning): Assistance needed to correctly position infant at breast and maintain latch.  LATCH Score: 8  Interventions Interventions: Breast feeding basics reviewed;Assisted with latch;Support pillows;Position options;Coconut oil;Education (Provided written educational materials regarding what helps and what and what hinders D-MER symptoms.)  Discharge Pump: Personal;Hands Free (Per mom she has 2 personal hands free pumps.)  Consult Status Consult Status: Follow-up Date: 09/28/24 Follow-up type: In-patient  Update provided to care nurse.  Jaclyn Patterson 09/27/2024, 2:31 PM

## 2024-09-27 NOTE — Progress Notes (Addendum)
 Obstetric Postpartum Daily Progress Note  Chief Complaint: Postpartum care after vaginal delivery  Subjective:  29 y.o. [id number] postpartum day #1 status post vaginal delivery.  She is ambulating, is tolerating po, is voiding spontaneously.  Her pain is well controlled on PO pain medications. Her RN reported to me that she had some bleeding overnight and a large clot at noon today. Another smaller one passed today.    Medications SCHEDULED MEDICATIONS   ibuprofen   600 mg Oral Q6H   measles, mumps & rubella vaccine  0.5 mL Subcutaneous Once   methylergonovine  0.2 mg Oral QID   prenatal multivitamin  1 tablet Oral Daily   senna-docusate  2 tablet Oral Q24H   sertraline   100 mg Oral Daily   sodium chloride  flush  3 mL Intravenous Q12H   Tdap  0.5 mL Intramuscular Once    MEDICATION INFUSIONS   sodium chloride       PRN MEDICATIONS  sodium chloride  flush **AND** sodium chloride  flush **AND** sodium chloride , acetaminophen , albuterol , benzocaine -Menthol , bisacodyl , coconut oil, witch hazel-glycerin  **AND** dibucaine, diphenhydrAMINE , ondansetron  **OR** ondansetron  (ZOFRAN ) IV, oxyCODONE , simethicone , sodium phosphate, zolpidem     Objective:   Vitals:   09/27/24 0811 09/27/24 1126 09/27/24 1300 09/27/24 1506  BP: 105/60 133/72 108/72 125/75  Pulse: 85 86 74 76  Resp: 18 20  18   Temp: 97.8 F (36.6 C) 98 F (36.7 C)  98 F (36.7 C)  TempSrc: Oral Axillary  Oral  SpO2: 100%  100%   Weight:      Height:        Current Vital Signs 24h Vital Sign Ranges  T 98 F (36.7 C) Temp  Avg: 98 F (36.7 C)  Min: 97.8 F (36.6 C)  Max: 98.2 F (36.8 C)  BP 125/75 BP  Min: 103/63  Max: 133/72  HR 76 Pulse  Avg: 79.6  Min: 74  Max: 86  RR 18 Resp  Avg: 18.3  Min: 18  Max: 20  SaO2 100 % Room Air SpO2  Avg: 98.6 %  Min: 97 %  Max: 100 %       24 Hour I/O Current Shift I/O  Time Ins Outs 01/18 0701 - 01/19 0700 In: 600 [P.O.:600] Out: 2140 [Urine:1600] No intake/output data recorded.   General: NAD Pulmonary: no increased work of breathing Abdomen: non-distended, non-tender, fundus firm at level of umbilicus Extremities: no edema, no erythema, no tenderness  Labs:  Recent Labs  Lab 09/25/24 1342 09/27/24 0724 09/27/24 1443  WBC 7.7 13.0* 12.1*  HGB 11.1* 9.1* 10.1*  HCT 32.4* 27.0* 29.4*  PLT 246 191 216     Assessment:   28 y.o. [id number] postpartum day # 1 status post SVD  Plan:   1) Acute blood loss anemia - hemodynamically stable and asymptomatic.  This is clinically significant as she had significant bleeding overnight and a large clot this afternoon. For now, PO ferrous sulfate. Continue to monitor. Per recent discussion with her RN, her bleeding is much improved after starting methergine  this afternoon.  - po ferrous sulfate  2) postpartum hemorrhage: currently managed with methergine .  Stable blood counts.  Continue to monitor.   3) A POS /  Rubella Immune (07/11 0000)/ Varicella Not immune  4)  Infant feeding: breast feeding 5)  Contraception: undecided 6)  Flu vaccine: Given prenatally 7)  Tdap vaccine: Given prenatally 8) RSV vaccine: Given during pregnancy >/=14 days ago  9) Disposition: likely home tomorrow, if bleeding stable.  Garnette Mace, MD  09/27/2024 8:12 PM

## 2024-09-27 NOTE — Anesthesia Postprocedure Evaluation (Signed)
"   Anesthesia Post Note  Patient: Barista  Procedure(s) Performed: AN AD HOC LABOR EPIDURAL  Patient location during evaluation: Mother Baby Anesthesia Type: Epidural Level of consciousness: awake and alert and oriented Pain management: pain level controlled Vital Signs Assessment: post-procedure vital signs reviewed and stable Respiratory status: respiratory function stable Cardiovascular status: stable Postop Assessment: no headache, no backache, able to ambulate, adequate PO intake, patient able to bend at knees and no apparent nausea or vomiting Anesthetic complications: no   No notable events documented.   Last Vitals:  Vitals:   09/26/24 2328 09/27/24 0314  BP: 120/65 103/63  Pulse: 77 79  Resp: 18 18  Temp: 36.8 C 36.7 C  SpO2: 97% 99%    Last Pain:  Vitals:   09/27/24 0550  TempSrc:   PainSc: 0-No pain                 Lorriane Romero FALCON      "

## 2024-09-28 ENCOUNTER — Encounter: Admitting: Physical Therapy

## 2024-09-28 NOTE — Progress Notes (Signed)
 Patient discharged. Discharge instructions given. Patient verbalizes understanding. Transported by axillary.

## 2024-09-28 NOTE — Lactation Note (Signed)
 This note was copied from a baby's chart. Lactation Consultation Note  Patient Name: Girl Andreah Goheen Unijb'd Date: 09/28/2024 Age:28 hours Reason for consult: Follow-up assessment;Term   Maternal Data  Visited with G3P2 Mom and 43 hour old baby girl Paisley. Dad was present. Mom breastfeeding on the left breast when [facility] students entered. Mom expressed that she was tired and ready to get home to her two year old, and that they would be leaving after she ate. When asked if tips and suggestions made by [facility] Pat helped mom said that they did but she still felt the butterflies and anxiety, but she is willing to push through.  Mom asked if she should pump Right breast after feeding baby since she is not latching well on the right breast.  LC student suggested offering baby both breasts during feeding time.  Feeding Mother's Current Feeding Choice: Breast Milk  Interventions Interventions: Education;CDC milk storage guidelines  Discharge Discharge Education: Engorgement and breast care Pump: Personal (Lansinoh, Medela, Elvie Personal Pumps)  Breast milk storage guidelines, engorgement protocol and mastitis prevention discussed with parents. LC students discussed STS, nutrition, and hydration. Parents verbalized understanding. Mom praised for all of her efforts. She was given outpatient number and she will call if needed.  Consult Status Consult Status: Complete Follow-up type: Call as needed    Marianne Daring 09/28/2024, 11:32 AM

## 2024-09-28 NOTE — Discharge Instructions (Signed)
Discharge instructions Bleeding: Your bleeding could continue up to 6 weeks, the flow should gradually decrease and the color should become dark then lightened over the next couple of weeks. If you notice you are bleeding heavily or passing clots larger than the size of your fist, PLEASE call your physician. No TAMPONS, DOUCHING, ENEMAS OR SEXUAL INTERCOURSE for 6 weeks. Stitches: Shower daily with mild soap and water. Stitches will dissolve over the next couple of weeks, if you experience any discomfort in the vaginal area you may sit in warm water 15-20 minutes, 3-4 times per day. Just enough water to cover vaginal area. AfterPains: This is the uterus contracting back to its normal position and size. Use medications prescribed or recommended by your physician to help relieve this discomfort. Bowels/Hemorrhoids: Drink plenty of water and stay active. Increase fiber, fresh fruits and vegetables in your diet. Rest/Activity: Rest when the baby is resting;  Do not lift > 10 lbs for 6 weeks. No driving for 1-2 weeks. Bathing: Shower daily! Diet: Continue daily prenatal vitamin and iron until your follow up visit to help replenish nutrients and vitamins. If breastfeeding eat extra calories and increase your fluid intake to 12 glasses a day. Contraception: Consult with your provider on what method of birth control you would like to use. Breastfeeding: You may have a slight fever when your milk comes in, but it should go away on its own. If it does not, and rises above 101.0 please call the doctor. Bottlefeeding: wear a snug fitting bra without underwires continuously for 3-5days, avoid any nipple/breast stimulation. If engorgement occurs, take ibuprofen as prescribed and apply fresh green cabbage leaves directly to your breasts inside the bra cups. Postpartum "BLUES": It is common to emotional days after delivery, however if it persist for greater than 2 weeks or if you feel concerned please let your physician  know immediately. This is hormone driven and nothing you can control so please let someone know how you feel. Follow Up Visit: Please schedule a follow up visit with your delivering provider  Call office if you have any of the following: headache, visual changes, fever >101.0 F, chills, breast concerns, excessive vaginal bleeding, incision drainage or problems, leg pain or redness, depression or any other concerns.  For concerns about your baby, please call your pediatrician For breastfeeding concerns, the lactation consultant can be reached at [phone]

## 2024-10-05 ENCOUNTER — Encounter: Admitting: Physical Therapy
